# Patient Record
Sex: Female | Born: 1990 | Hispanic: Yes | Marital: Single
Health system: Midwestern US, Community
[De-identification: ages and names within clinical notes are randomized; demographics above are authoritative.]

## PROBLEM LIST (undated history)

## (undated) ENCOUNTER — Inpatient Hospital Stay (HOSPITAL_COMMUNITY): Payer: Self-pay

## (undated) DIAGNOSIS — B009 Herpesviral infection, unspecified: Secondary | ICD-10-CM

## (undated) DIAGNOSIS — B9689 Other specified bacterial agents as the cause of diseases classified elsewhere: Secondary | ICD-10-CM

## (undated) DIAGNOSIS — F419 Anxiety disorder, unspecified: Secondary | ICD-10-CM

## (undated) DIAGNOSIS — N760 Acute vaginitis: Secondary | ICD-10-CM

## (undated) DIAGNOSIS — O471 False labor at or after 37 completed weeks of gestation: Secondary | ICD-10-CM

## (undated) DIAGNOSIS — R1011 Right upper quadrant pain: Principal | ICD-10-CM

## (undated) DIAGNOSIS — N898 Other specified noninflammatory disorders of vagina: Secondary | ICD-10-CM

## (undated) DIAGNOSIS — K6289 Other specified diseases of anus and rectum: Secondary | ICD-10-CM

## (undated) HISTORY — PX: BUNIONECTOMY: SHX129

---
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
## 2013-07-27 ENCOUNTER — Other Ambulatory Visit: Payer: Self-pay | Admitting: Physician Assistant

## 2013-07-27 ENCOUNTER — Other Ambulatory Visit: Payer: Self-pay | Admitting: Internal Medicine

## 2013-07-27 DIAGNOSIS — R1011 Right upper quadrant pain: Secondary | ICD-10-CM

## 2013-07-28 ENCOUNTER — Other Ambulatory Visit: Payer: Self-pay

## 2013-08-01 ENCOUNTER — Other Ambulatory Visit: Payer: Self-pay

## 2013-11-12 ENCOUNTER — Emergency Department (HOSPITAL_COMMUNITY)
Admission: EM | Admit: 2013-11-12 | Discharge: 2013-11-12 | Disposition: A | Discharge status: Home / Self Care | Payer: Medicaid Other | Attending: Emergency Medicine | Admitting: Emergency Medicine

## 2013-11-12 ENCOUNTER — Encounter (HOSPITAL_COMMUNITY): Payer: Self-pay | Admitting: Emergency Medicine

## 2013-11-12 DIAGNOSIS — H9209 Otalgia, unspecified ear: Secondary | ICD-10-CM | POA: Insufficient documentation

## 2013-11-12 DIAGNOSIS — J069 Acute upper respiratory infection, unspecified: Secondary | ICD-10-CM

## 2013-11-12 DIAGNOSIS — O9989 Other specified diseases and conditions complicating pregnancy, childbirth and the puerperium: Secondary | ICD-10-CM | POA: Insufficient documentation

## 2013-11-12 MED ORDER — CHLORPHENIRAMINE MALEATE 4 MG PO TABS: 4.0000 mg | ORAL_TABLET | Freq: Two times a day (BID) | ORAL | Status: DC | PRN

## 2013-11-12 MED ORDER — IPRATROPIUM BROMIDE 0.03 % NA SOLN: 2.0000 | Freq: Two times a day (BID) | NASAL | Status: DC

## 2013-11-12 NOTE — Discharge Instructions (Signed)

## 2013-11-12 NOTE — ED Notes (Signed)
Per pt sts cough, congestion, sore throat and ear pain for a few days. sts she hasn't taken anything OTC due to being [redacted] weeks pregnant.

## 2013-11-12 NOTE — ED Provider Notes (Signed)
CSN: [id number]     Arrival date & time 11/12/13  1728 History   None    This chart was scribed for non-physician practitioner, Junious Silk PA-C, working with Flint Melter, MD by Arlan Organ, ED Scribe. This patient was seen in room TR08C/TR08C and the patient's care was started at 5:38 PM.   Chief Complaint  Patient presents with  . URI   The history is provided by the patient. No language interpreter was used.    HPI Comments: Alyssa Mcbride is a 23 y.o. female [redacted] weeks gestation who presents to the Emergency Department complaining of a URI x 3 days that is progressively worsening. She reports a non-productive cough, congestion, sore throat, rhinorrhea, myalgias, subjective fever, and left sided otalgia. She states she has tried OTC  Tylenol with no noticeable improvement. Last dose of Tylenol 10 AM this morning. At this time she denies any vomiting. Pt has no pertinent medical history, and no other concerns this visit.   History reviewed. No pertinent past medical history. History reviewed. No pertinent past surgical history. No family history on file. History  Substance Use Topics  . Smoking status: Not on file  . Smokeless tobacco: Not on file  . Alcohol Use: Not on file   OB History   Grav Para Term Preterm Abortions TAB SAB Ect Mult Living                 Review of Systems  Constitutional: Positive for fever. Negative for chills.  HENT: Positive for congestion, ear pain, rhinorrhea and sore throat.   Eyes: Negative for redness.  Respiratory: Positive for cough.   Gastrointestinal: Negative for vomiting.  Musculoskeletal: Positive for myalgias.  Skin: Negative for rash.  Psychiatric/Behavioral: Negative for confusion.  All other systems reviewed and are negative.      Allergies  Review of patient's allergies indicates not on file.  Home Medications  No current outpatient prescriptions on file.  Triage Vitals: BP 128/79  Pulse 94  Temp(Src) 98.8 F (37.1  C) (Oral)  Resp 16  Ht 5\' 4"  (1.626 m)  Wt 148 lb 2 oz (67.189 kg)  BMI 25.41 kg/m2  SpO2 100%   Physical Exam  Nursing note and vitals reviewed. Constitutional: She is oriented to person, place, and time. She appears well-developed and well-nourished. No distress.  HENT:  Head: Normocephalic and atraumatic.  Right Ear: Hearing, tympanic membrane, external ear and ear canal normal.  Left Ear: Hearing, tympanic membrane, external ear and ear canal normal.  Nose: Rhinorrhea present. Right sinus exhibits no maxillary sinus tenderness and no frontal sinus tenderness. Left sinus exhibits no maxillary sinus tenderness and no frontal sinus tenderness.  Mouth/Throat: Oropharynx is clear and moist and mucous membranes are normal. No oropharyngeal exudate, posterior oropharyngeal edema, posterior oropharyngeal erythema or tonsillar abscesses.  Eyes: Conjunctivae are normal.  Neck: Normal range of motion.  Cardiovascular: Normal rate, regular rhythm and normal heart sounds.   Pulmonary/Chest: Effort normal and breath sounds normal. No stridor. No respiratory distress. She has no wheezes. She has no rales.  Abdominal: Soft. She exhibits no distension.  Musculoskeletal: Normal range of motion.  Neurological: She is alert and oriented to person, place, and time. She has normal strength.  Skin: Skin is warm and dry. She is not diaphoretic. No erythema.  Psychiatric: She has a normal mood and affect. Her behavior is normal.    ED Course  Procedures (including critical care time)  DIAGNOSTIC STUDIES: Oxygen Saturation is 100%  on RA, Normal by my interpretation.    COORDINATION OF CARE: 5:57 PM-Discussed treatment plan with pt at bedside and pt agreed to plan.     Labs Review Labs Reviewed - No data to display Imaging Review No results found.   EKG Interpretation None      MDM   Final diagnoses:  URI (upper respiratory infection)    Patients symptoms are consistent with URI, likely  viral etiology. Discussed that antibiotics are not indicated for viral infections. Pt will be discharged with symptomatic treatment.  Verbalizes understanding and is agreeable with plan. Pt is hemodynamically stable & in NAD prior to dc.   I personally performed the services described in this documentation, which was scribed in my presence. The recorded information has been reviewed and is accurate.    Mora BellmanHannah S Lorieann Argueta, PA-C 11/12/13 1845

## 2013-11-13 NOTE — ED Provider Notes (Signed)
Medical screening examination/treatment/procedure(s) were performed by non-physician practitioner and as supervising physician I was immediately available for consultation/collaboration.  Emanuelle Hammerstrom L Denia Mcvicar, MD 11/13/13 0040 

## 2013-12-04 ENCOUNTER — Emergency Department (INDEPENDENT_AMBULATORY_CARE_PROVIDER_SITE_OTHER)
Admission: EM | Admit: 2013-12-04 | Discharge: 2013-12-04 | Disposition: A | Discharge status: Home / Self Care | Payer: Self-pay | Source: Home / Self Care | Attending: Emergency Medicine | Admitting: Emergency Medicine

## 2013-12-04 ENCOUNTER — Other Ambulatory Visit (HOSPITAL_COMMUNITY)
Admission: RE | Admit: 2013-12-04 | Discharge: 2013-12-04 | Disposition: A | Discharge status: Home / Self Care | Payer: Medicaid Other | Source: Ambulatory Visit | Attending: Emergency Medicine | Admitting: Emergency Medicine

## 2013-12-04 ENCOUNTER — Encounter (HOSPITAL_COMMUNITY): Payer: Self-pay | Admitting: Emergency Medicine

## 2013-12-04 DIAGNOSIS — N760 Acute vaginitis: Secondary | ICD-10-CM | POA: Insufficient documentation

## 2013-12-04 DIAGNOSIS — Z113 Encounter for screening for infections with a predominantly sexual mode of transmission: Secondary | ICD-10-CM | POA: Insufficient documentation

## 2013-12-04 DIAGNOSIS — A499 Bacterial infection, unspecified: Secondary | ICD-10-CM

## 2013-12-04 DIAGNOSIS — B009 Herpesviral infection, unspecified: Secondary | ICD-10-CM

## 2013-12-04 DIAGNOSIS — B9689 Other specified bacterial agents as the cause of diseases classified elsewhere: Secondary | ICD-10-CM

## 2013-12-04 HISTORY — DX: Other specified bacterial agents as the cause of diseases classified elsewhere: B96.89

## 2013-12-04 HISTORY — DX: Other specified bacterial agents as the cause of diseases classified elsewhere: N76.0

## 2013-12-04 LAB — POCT URINALYSIS DIP (DEVICE)
BILIRUBIN URINE: NEGATIVE
Glucose, UA: NEGATIVE mg/dL
KETONES UR: NEGATIVE mg/dL
Leukocytes, UA: NEGATIVE
Nitrite: NEGATIVE
PH: 7 (ref 5.0–8.0)
Protein, ur: NEGATIVE mg/dL
Urobilinogen, UA: 1 mg/dL (ref 0.0–1.0)

## 2013-12-04 LAB — POCT PREGNANCY, URINE: PREG TEST UR: NEGATIVE

## 2013-12-04 MED ORDER — ACYCLOVIR 400 MG PO TABS: ORAL_TABLET | ORAL | Status: DC

## 2013-12-04 MED ORDER — ACYCLOVIR 400 MG PO TABS: 400.0000 mg | ORAL_TABLET | Freq: Two times a day (BID) | ORAL | Status: DC

## 2013-12-04 MED ORDER — METRONIDAZOLE 500 MG PO TABS: 500.0000 mg | ORAL_TABLET | Freq: Two times a day (BID) | ORAL | Status: DC

## 2013-12-04 NOTE — Discharge Instructions (Signed)
Bacterial Vaginosis Bacterial vaginosis is a vaginal infection that occurs when the normal balance of bacteria in the vagina is disrupted. It results from an overgrowth of certain bacteria. This is the most common vaginal infection in women of childbearing age. Treatment is important to prevent complications, especially in pregnant women, as it can cause a premature delivery. CAUSES  Bacterial vaginosis is caused by an increase in harmful bacteria that are normally present in smaller amounts in the vagina. Several different kinds of bacteria can cause bacterial vaginosis. However, the reason that the condition develops is not fully understood. RISK FACTORS Certain activities or behaviors can put you at an increased risk of developing bacterial vaginosis, including:  Having a new sex partner or multiple sex partners.  Douching.  Using an intrauterine device (IUD) for contraception. Women do not get bacterial vaginosis from toilet seats, bedding, swimming pools, or contact with objects around them. SIGNS AND SYMPTOMS  Some women with bacterial vaginosis have no signs or symptoms. Common symptoms include:  Grey vaginal discharge.  A fishlike odor with discharge, especially after sexual intercourse.  Itching or burning of the vagina and vulva.  Burning or pain with urination. DIAGNOSIS  Your health care provider will take a medical history and examine the vagina for signs of bacterial vaginosis. A sample of vaginal fluid may be taken. Your health care provider will look at this sample under a microscope to check for bacteria and abnormal cells. A vaginal pH test may also be done.  TREATMENT  Bacterial vaginosis may be treated with antibiotic medicines. These may be given in the form of a pill or a vaginal cream. A second round of antibiotics may be prescribed if the condition comes back after treatment.  HOME CARE INSTRUCTIONS   Only take over-the-counter or prescription medicines as  directed by your health care provider.  If antibiotic medicine was prescribed, take it as directed. Make sure you finish it even if you start to feel better.  Do not have sex until treatment is completed.  Tell all sexual partners that you have a vaginal infection. They should see their health care provider and be treated if they have problems, such as a mild rash or itching.  Practice safe sex by using condoms and only having one sex partner. SEEK MEDICAL CARE IF:   Your symptoms are not improving after 3 days of treatment.  You have increased discharge or pain.  You have a fever. MAKE SURE YOU:   Understand these instructions.  Will watch your condition.  Will get help right away if you are not doing well or get worse. FOR MORE INFORMATION  Centers for Disease Control and Prevention, Division of STD Prevention: [employer].za American Sexual Health Association (ASHA): www.ashastd.org  Document Released: 08/11/2005 Document Revised: 06/01/2013 Document Reviewed: 03/23/2013 [facility] Patient Information 2014 [place], [place].  Herpes Simplex Virus Herpes simplex virus is a viral infection that may infect many different areas of the body, such as the genitalia and mouth. There are two different strains of the virus: herpes simplex virus 1 (HSV-1) and herpes simplex virus 2 (HSV-2). HSV-1 is typically associated with infections of the mouth and lips. HSV-2 is associated with infections of the genitals. However, either strain of the virus may infect any area. HSV may be spread through saliva particles or sexual contact. One unusual form of HSV-1, known as herpes gladiatorum, is passed from skin-to-skin contact, such as in wrestling. SYMPTOMS   Sometimes, no symptoms.  Fever.  Headache.  Muscle  aches.  Tingling.  Itching.  Tenderness.  Genital burning feeling.  Genital pain.  Pain with urination.  Pain with sexual intercourse.  Small blisters in the affected  areas. RISK FACTORS   Kissing an infected person.  Sharing eating utensils with an infected person.  Unprotected sexual activity.  Multiple sexual partners.  Direct contact sports without protective clothing.  Contact with an exposed herpes sore.  Stress, illness, and cold increase the risk of recurrence. PROGNOSIS  The primary outbreak of an HSV infection usually lasts 2 to 3 weeks. However, it has been known to last up to 6 weeks. After the primary outbreak subsides, the virus goes into a stage known as latency. During this time, there may be no physical symptoms of infection. After a period of time, some event, such as stress, cold, or illness will trigger another outbreak. This cycle of latency and outbreak may continue indefinitely. The outbreaks usually become milder over time. The body cannot rid itself of HSV. RELATED COMPLICATIONS   Recurrence.  Infection in other areas of the body, such as the eye (ocular herpetic infection, keratitis) and rarely the brain (herpetic encephalitis). TREATMENT  Many HSV infections can be treated without medicine. During an outbreak, avoid touching the sores. Ice may be used to dull the pain and suppress the virus. Exposure to the sun is a common trigger for an outbreak, so the use of sunscreen may help in such cases. Avoid sexual contact during outbreaks. During the latent periods, it is advised that you use latex condoms, which will reduce the likelihood of spreading the virus to another person. Condoms made from animal products do not protect against HSV. Female condoms cover a larger area than female condoms, and may offer the most protection from the transmission of HSV. The presence of HSV will not affect a condom's ability to protect against pregnancy. Only take medicines for pain and discomfort if directed to do so by your caregiver. Many claims exist that certain dietary changes will prevent an outbreak, but these claims have not been proven.  These claims include eating foods that are high in L-lysine and low in arginine (i.e. yogurt, beets, apples, pears, mangoes, oily fish (such as salmon, haddock, snapper, and swordfish), soybean sprouts, chicken, and tomatoes).  Athletes may return to play once they are showing no symptoms, and they have been treated.  Document Released: 08/11/2005 Document Revised: 11/03/2011 Document Reviewed: 11/23/2008 [facility] Patient Information 2014 [place].

## 2013-12-04 NOTE — ED Provider Notes (Signed)
Chief Complaint   Chief Complaint  Patient presents with  . Vaginitis  . Rash    History of Present Illness   Alyssa Mcbride is a 23 year old female who's had a one-week history of vaginal discharge and odor. She denies any itching. She has had mild pelvic pain and cramping. No fever, chills, or vomiting. She has been somewhat nauseated. Last menstrual period was March 30. The patient also has a 2 to three-day history of an itchy rash on her left buttock. This occurs in the same spot about every 6 months.  Review of Systems   Other than as noted above, the patient denies any of the following symptoms: Systemic:  No fever or chills GI:  No abdominal pain, nausea, vomiting, diarrhea, constipation, melena or hematochezia. GU:  No dysuria, frequency, urgency, hematuria, vaginal discharge, itching, or abnormal vaginal bleeding.  PMFSH   Past medical history, family history, social history, meds, and allergies were reviewed.    Physical Examination    Vital signs:  BP 126/80  Pulse 80  Temp(Src) 98.4 F (36.9 C) (Oral)  Resp 16  SpO2 100%  LMP 11/21/2013  Breastfeeding? Unknown General:  Alert, oriented and in no distress. Lungs:  Breath sounds clear and equal bilaterally.  No wheezes, rales or rhonchi. Heart:  Regular rhythm.  No gallops or murmers. Abdomen:  Soft, flat and non-distended.  No organomegaly or mass.  No tenderness, guarding or rebound.  Bowel sounds normally active. Pelvic exam:  Normal external genitalia, vaginal and cervical mucosa were normal, there was no vaginal discharge or odor. No pain on cervical motion, uterus was normal in size and shape and nontender. No adnexal masses or tenderness.  DNA probes for gonorrhea, Chlamydia, Trichomonas, Gardnerella, Candida were obtained. Skin:  There is a patch of vesicles on erythematous base on the left buttock.  Labs   Results for orders placed during the hospital encounter of 12/04/13  POCT URINALYSIS DIP (DEVICE)       Result Value Ref Range   Glucose, UA NEGATIVE  NEGATIVE mg/dL   Bilirubin Urine NEGATIVE  NEGATIVE   Ketones, ur NEGATIVE  NEGATIVE mg/dL   Specific Gravity, Urine >=1.030  1.005 - 1.030   Hgb urine dipstick TRACE (*) NEGATIVE   pH 7.0  5.0 - 8.0   Protein, ur NEGATIVE  NEGATIVE mg/dL   Urobilinogen, UA 1.0  0.0 - 1.0 mg/dL   Nitrite NEGATIVE  NEGATIVE   Leukocytes, UA NEGATIVE  NEGATIVE  POCT PREGNANCY, URINE      Result Value Ref Range   Preg Test, Ur NEGATIVE  NEGATIVE    A herpes simplex viral culture was obtained of the lesions on the left buttock.  Assessment   The primary encounter diagnosis was Bacterial vaginosis. 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  Discussed herpes simplex viral infection with the patient. She was given a course of Zovirax to take 1 every 4 hours while awake 5 times daily for the next week followed by one tablet twice a day for suppression.       Plan    1.  Meds:  The following meds were prescribed:   Discharge Medication List as of 12/04/2013  7:33 PM    START taking these medications   Details  !! acyclovir (ZOVIRAX) 400 MG tablet 1 every 4 hours while awake, 5 times daily for 1 week., Normal    !! acyclovir (ZOVIRAX) 400 MG tablet Take 1 tablet (400 mg total) by  mouth 2 (two) times daily., Starting 12/04/2013, Until Discontinued, Print    metroNIDAZOLE (FLAGYL) 500 MG tablet Take 1 tablet (500 mg total) by mouth 2 (two) times daily., Starting 12/04/2013, Until Discontinued, Normal     !! - Potential duplicate medications found. Please discuss with provider.      2.  Patient Education/Counseling:  The patient was given appropriate handouts, self care instructions, and instructed in symptomatic relief.    3.  Follow up:  The patient was told to follow up here if no better in 3 to 4 days, or sooner if becoming worse in any way, and given some red flag symptoms such as worsening pain, fever, persistent vomiting, or  heavy vaginal bleeding which would prompt immediate return.       Reuben Likes, MD 12/04/13 [phone]

## 2013-12-04 NOTE — ED Notes (Signed)
C/O foul vaginal odor x 2 wks, vaginal discharge x 1 wk.  Has had BV in past.  Also c/o pruritic rash on buttock x 2 days.  Now c/o low abd cramping.

## 2013-12-05 LAB — CERVICOVAGINAL ANCILLARY ONLY
Chlamydia: NEGATIVE
NEISSERIA GONORRHEA: NEGATIVE

## 2013-12-05 NOTE — Progress Notes (Signed)
Quick Note:  Test result was normal. No further action is needed at this time. ______ 

## 2013-12-05 NOTE — ED Notes (Signed)
GC/Chlamydia neg., Affirm: Candida and Trich neg., Gardnerella pos.  Pt. adequately treated with Flagyl.  Herpes culture pending. Desiree LucySuzanne M [facility] 12/05/2013

## 2013-12-06 LAB — CERVICOVAGINAL ANCILLARY ONLY
WET PREP (BD AFFIRM): NEGATIVE
WET PREP (BD AFFIRM): POSITIVE — AB
Wet Prep (BD Affirm): NEGATIVE

## 2013-12-07 ENCOUNTER — Telehealth (HOSPITAL_COMMUNITY): Payer: Self-pay | Admitting: *Deleted

## 2013-12-07 LAB — HERPES SIMPLEX VIRUS CULTURE: Culture: DETECTED

## 2013-12-07 NOTE — ED Notes (Signed)
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 Pt. adequately treated with Zovirax.  I called pt. Pt. verified x 2 and given results. Pt. told she was adequately treated for bacterial vaginosis with Flagyl and Herpes with Zovirax.  Pt. instructed to notify her partner, that she can pass the virus even when she doesn't have an outbreak, so always practice safe sex.  Instructed to get treated for each outbreak with Acyclovir or Valtrex. You should get an OB-GYN doctor or PCP who can give you a 1 yr prescription to fill when you have an outbreak or give suppressive therapy if needed. Pt. said she does not have insurance. I recommended she try to get some.  Pt. voiced understanding. Alyssa LucySuzanne Mcbride [facility]Alyssa Mcbride 12/07/2013

## 2014-03-06 ENCOUNTER — Other Ambulatory Visit (HOSPITAL_COMMUNITY): Payer: Self-pay | Admitting: Nurse Practitioner

## 2014-03-06 DIAGNOSIS — Z3682 Encounter for antenatal screening for nuchal translucency: Secondary | ICD-10-CM

## 2014-03-06 LAB — OB RESULTS CONSOLE ABO/RH: RH TYPE: POSITIVE

## 2014-03-06 LAB — OB RESULTS CONSOLE ANTIBODY SCREEN: ANTIBODY SCREEN: NEGATIVE

## 2014-03-06 LAB — OB RESULTS CONSOLE RUBELLA ANTIBODY, IGM: Rubella: IMMUNE

## 2014-03-06 LAB — OB RESULTS CONSOLE RPR: RPR: NONREACTIVE

## 2014-03-06 LAB — OB RESULTS CONSOLE HEPATITIS B SURFACE ANTIGEN: Hepatitis B Surface Ag: NEGATIVE

## 2014-03-06 LAB — OB RESULTS CONSOLE HIV ANTIBODY (ROUTINE TESTING): HIV: NONREACTIVE

## 2014-03-08 ENCOUNTER — Other Ambulatory Visit (HOSPITAL_COMMUNITY): Payer: Self-pay | Admitting: Nurse Practitioner

## 2014-03-08 ENCOUNTER — Ambulatory Visit (HOSPITAL_COMMUNITY)
Admission: RE | Admit: 2014-03-08 | Discharge: 2014-03-08 | Disposition: A | Discharge status: Home / Self Care | Payer: Medicaid Other | Source: Ambulatory Visit | Attending: Nurse Practitioner | Admitting: Nurse Practitioner

## 2014-03-08 ENCOUNTER — Ambulatory Visit (HOSPITAL_COMMUNITY): Admission: RE | Admit: 2014-03-08 | Payer: Medicaid Other | Source: Ambulatory Visit

## 2014-03-08 ENCOUNTER — Encounter (HOSPITAL_COMMUNITY): Payer: Self-pay

## 2014-03-08 DIAGNOSIS — Z3682 Encounter for antenatal screening for nuchal translucency: Secondary | ICD-10-CM

## 2014-03-08 DIAGNOSIS — Z36 Encounter for antenatal screening of mother: Secondary | ICD-10-CM | POA: Insufficient documentation

## 2014-03-20 LAB — OB RESULTS CONSOLE GC/CHLAMYDIA
Chlamydia: NEGATIVE
Gonorrhea: NEGATIVE

## 2014-03-23 ENCOUNTER — Encounter (HOSPITAL_COMMUNITY): Payer: Self-pay

## 2014-03-23 ENCOUNTER — Ambulatory Visit (HOSPITAL_COMMUNITY)
Admission: RE | Admit: 2014-03-23 | Discharge: 2014-03-23 | Disposition: A | Discharge status: Home / Self Care | Payer: Medicaid Other | Source: Ambulatory Visit | Attending: Nurse Practitioner | Admitting: Nurse Practitioner

## 2014-03-23 ENCOUNTER — Other Ambulatory Visit: Payer: Self-pay

## 2014-03-23 DIAGNOSIS — Z36 Encounter for antenatal screening of mother: Secondary | ICD-10-CM | POA: Insufficient documentation

## 2014-03-23 DIAGNOSIS — Z3682 Encounter for antenatal screening for nuchal translucency: Secondary | ICD-10-CM

## 2014-03-28 ENCOUNTER — Other Ambulatory Visit (HOSPITAL_COMMUNITY): Payer: Self-pay | Admitting: Nurse Practitioner

## 2014-03-28 DIAGNOSIS — Z3689 Encounter for other specified antenatal screening: Secondary | ICD-10-CM

## 2014-04-10 ENCOUNTER — Ambulatory Visit (INDEPENDENT_AMBULATORY_CARE_PROVIDER_SITE_OTHER): Payer: Medicaid Other | Admitting: Internal Medicine

## 2014-04-10 ENCOUNTER — Encounter: Payer: Self-pay | Admitting: Internal Medicine

## 2014-04-10 VITALS — BP 100/71 | HR 75 | Ht 64.0 in | Wt 147.0 lb

## 2014-04-10 DIAGNOSIS — R002 Palpitations: Secondary | ICD-10-CM

## 2014-04-10 NOTE — Patient Instructions (Addendum)
Your physician recommends that you continue on your current medications as directed. Please refer to the Current Medication list given to you today. Your physician recommends that you schedule a follow-up appointment AS NECESSARY WITH DR ROSS.

## 2014-04-10 NOTE — Progress Notes (Signed)
HPI Patient is a 23 yo who is referred for evalaution of chest discomfort   The patient has no known history of cardiac problems.  She is currently [redacted] wks pregnant.(first pregnancy)  Says she has had episode of a heaviness in chst  Weird feeling  Not every day.  Occaionally dizzy.  One time almost passed out. Over the past week has been feeling better.  No spells   No Known Allergies  Current Outpatient Prescriptions  Medication Sig Dispense Refill  . Prenatal Vit-Fe Fumarate-FA (MULTIVITAMIN-PRENATAL) 27-0.8 MG TABS tablet Take 1 tablet by mouth daily at 12 noon.       No current facility-administered medications for this visit.    Past Medical History  Diagnosis Date  . BV (bacterial vaginosis)     History reviewed. No pertinent past surgical history.  Family History  Problem Relation Age of Onset  . Heart attack Neg Hx   . Hypertension Mother     History   Social History  . Marital Status: Single    Spouse Name: N/A    Number of Children: N/A  . Years of Education: N/A   Occupational History  . Not on file.   Social History Main Topics  . Smoking status: Never Smoker   . Smokeless tobacco: Not on file  . Alcohol Use: Yes     Comment: occasional  . Drug Use: Yes    Special: Marijuana  . Sexual Activity: Yes    Birth Control/ Protection: Condom   Other Topics Concern  . Not on file   Social History Narrative  . No narrative on file    Review of Systems:  All systems reviewed.  They are negative to the above problem except as previously stated.  Vital Signs: BP 100/71  Pulse 75  Ht 5\' 4"  (1.626 m)  Wt 147 lb (66.679 kg)  BMI 25.22 kg/m2  LMP 12/05/2013  Physical Exam Patient is in NAD HEENT:  Normocephalic, atraumatic. EOMI, PERRLA.  Neck: JVP is normal.  No bruits.  Lungs: clear to auscultation. No rales no wheezes.  Heart: Regular rate and rhythm. Normal S1, S2. No S3.   No significant murmurs. PMI not displaced.  Abdomen:  Supple, nontender.  Normal bowel sounds. No masses. No hepatomegaly.  Extremities:   Good distal pulses throughout. No lower extremity edema.  Musculoskeletal :moving all extremities.  Neuro:   alert and oriented x3.  CN II-XII grossly intact.  EKG  SR 75 bpm    Assessment and Plan:  1.  Chest discomfort  Patient is vague on symptoms.  Improving   I am not convinced there is a problem  May be related to changes with pregnancy, maintenance of BP Since symptoms have eased I would recomm following   Encouraged her to stay hydrated.  Stay active.   Will be available as needed.

## 2014-04-20 ENCOUNTER — Inpatient Hospital Stay (HOSPITAL_COMMUNITY)
Admission: AD | Admit: 2014-04-20 | Discharge: 2014-04-21 | Disposition: A | Discharge status: Home / Self Care | Payer: Medicaid Other | Source: Ambulatory Visit | Attending: Obstetrics & Gynecology | Admitting: Obstetrics & Gynecology

## 2014-04-20 ENCOUNTER — Encounter (HOSPITAL_COMMUNITY): Payer: Self-pay | Admitting: *Deleted

## 2014-04-20 DIAGNOSIS — B3731 Acute candidiasis of vulva and vagina: Secondary | ICD-10-CM

## 2014-04-20 DIAGNOSIS — N760 Acute vaginitis: Secondary | ICD-10-CM | POA: Insufficient documentation

## 2014-04-20 DIAGNOSIS — O2390 Unspecified genitourinary tract infection in pregnancy, unspecified trimester: Secondary | ICD-10-CM | POA: Insufficient documentation

## 2014-04-20 DIAGNOSIS — N909 Noninflammatory disorder of vulva and perineum, unspecified: Secondary | ICD-10-CM | POA: Insufficient documentation

## 2014-04-20 DIAGNOSIS — B373 Candidiasis of vulva and vagina: Secondary | ICD-10-CM

## 2014-04-20 DIAGNOSIS — B9689 Other specified bacterial agents as the cause of diseases classified elsewhere: Secondary | ICD-10-CM

## 2014-04-20 DIAGNOSIS — B009 Herpesviral infection, unspecified: Secondary | ICD-10-CM | POA: Diagnosis present

## 2014-04-20 HISTORY — DX: Herpesviral infection, unspecified: B00.9

## 2014-04-20 LAB — WET PREP, GENITAL: TRICH WET PREP: NONE SEEN

## 2014-04-20 MED ORDER — FLUCONAZOLE 150 MG PO TABS: 150.0000 mg | ORAL_TABLET | Freq: Once | ORAL | Status: DC

## 2014-04-20 MED ORDER — METRONIDAZOLE 500 MG PO TABS
500.0000 mg | ORAL_TABLET | Freq: Two times a day (BID) | ORAL | Status: AC
Start: 2014-04-20 — End: 2014-04-27

## 2014-04-20 NOTE — MAU Provider Note (Addendum)
Faculty Practice OB/GYN Attending Note  Subjective:  23 y.o. G2P0010 at [redacted]w[redacted]d here for evaluation of vulvar irritation; reports feeling "dry and swollen" with vaginal itching and thick yellow discharge.  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  Denies any fevers.  Gets prenatal care at [facility]      Objective:  Blood pressure 109/59, pulse 79, temperature 98.2 F (36.8 C), temperature source Oral, resp. rate 20, height  (1.6 m), weight 152 lb 4 oz (69.06 kg), last menstrual period 12/05/2013, unknown if currently breastfeeding. FHT  Doppler 152 bpm Gen: NAD Pelvic: Normal external genitalia, no ulcers seen, no erythema. On speculum exam, copious amount of thick, yellow discharge in vaginal vault with mild erythema.  Ext: 2+ DTRs, no edema, no cyanosis, negative Homan's sign  Results for orders placed during the hospital encounter of 04/20/14 (from the past 24 hour(s))  WET PREP, GENITAL     Status: Abnormal   Collection Time    04/20/14 11:05 PM      Result Value Ref Range   Yeast Wet Prep HPF POC FEW (*) NONE SEEN   Trich, Wet Prep NONE SEEN  NONE SEEN   Clue Cells Wet Prep HPF POC FEW (*) NONE SEEN   WBC, Wet Prep HPF POC FEW (*) NONE SEEN    Assessment & Plan:  23 y.o. G2P0010 at [redacted]w[redacted]d with vulvovaginitis consistent with possible yeast infection, Wet prep done which shows few yeast and clue cells concerning for BV, GC/Chlam pending Diflucan and Metronidazole ordered Follow up with [facility] for routine prenatal care   Jaynie Collins, MD, FACOG Attending Obstetrician & Gynecologist Faculty Practice, [facility]

## 2014-04-20 NOTE — MAU Note (Addendum)
PT SAYS   ON Friday -  HAD VAG ITCHING AND D/C- BUT DRY  AND PAINFUL.  FEELS SWOLLEN.    LAST SEX-  8-17.  GETS [facility] [place]- [facility].   SEEN LAST  -  8-7.  NEXT APPOINTMENT --9-8.    SAYS SOMETIMES  SHE HAS H/A-  BUT NONE NOW.

## 2014-04-21 DIAGNOSIS — B3731 Acute candidiasis of vulva and vagina: Secondary | ICD-10-CM

## 2014-04-21 DIAGNOSIS — N760 Acute vaginitis: Secondary | ICD-10-CM

## 2014-04-21 DIAGNOSIS — A499 Bacterial infection, unspecified: Secondary | ICD-10-CM

## 2014-04-21 DIAGNOSIS — B373 Candidiasis of vulva and vagina: Secondary | ICD-10-CM

## 2014-04-21 DIAGNOSIS — B9689 Other specified bacterial agents as the cause of diseases classified elsewhere: Secondary | ICD-10-CM

## 2014-04-21 LAB — GC/CHLAMYDIA PROBE AMP
CT Probe RNA: NEGATIVE
GC Probe RNA: NEGATIVE

## 2014-05-10 ENCOUNTER — Ambulatory Visit (HOSPITAL_COMMUNITY): Payer: Medicaid Other

## 2014-05-10 ENCOUNTER — Ambulatory Visit (HOSPITAL_COMMUNITY)
Admission: RE | Admit: 2014-05-10 | Discharge: 2014-05-10 | Disposition: A | Discharge status: Home / Self Care | Payer: Medicaid Other | Source: Ambulatory Visit | Attending: Nurse Practitioner | Admitting: Nurse Practitioner

## 2014-05-10 DIAGNOSIS — Z3689 Encounter for other specified antenatal screening: Secondary | ICD-10-CM | POA: Insufficient documentation

## 2014-05-23 ENCOUNTER — Other Ambulatory Visit (HOSPITAL_COMMUNITY): Payer: Self-pay | Admitting: Nurse Practitioner

## 2014-05-23 DIAGNOSIS — Z3689 Encounter for other specified antenatal screening: Secondary | ICD-10-CM

## 2014-06-14 ENCOUNTER — Ambulatory Visit (HOSPITAL_COMMUNITY)
Admission: RE | Admit: 2014-06-14 | Discharge: 2014-06-14 | Disposition: A | Discharge status: Home / Self Care | Payer: Medicaid Other | Source: Ambulatory Visit | Attending: Nurse Practitioner | Admitting: Nurse Practitioner

## 2014-06-14 DIAGNOSIS — Z36 Encounter for antenatal screening of mother: Secondary | ICD-10-CM | POA: Insufficient documentation

## 2014-06-14 DIAGNOSIS — Z3A24 24 weeks gestation of pregnancy: Secondary | ICD-10-CM | POA: Diagnosis not present

## 2014-06-14 DIAGNOSIS — Z3689 Encounter for other specified antenatal screening: Secondary | ICD-10-CM

## 2014-06-26 ENCOUNTER — Encounter (HOSPITAL_COMMUNITY): Payer: Self-pay | Admitting: *Deleted

## 2014-07-28 ENCOUNTER — Encounter (HOSPITAL_COMMUNITY): Payer: Self-pay | Admitting: General Practice

## 2014-07-28 ENCOUNTER — Inpatient Hospital Stay (HOSPITAL_COMMUNITY)
Admission: AD | Admit: 2014-07-28 | Discharge: 2014-07-28 | Disposition: A | Discharge status: Home / Self Care | Payer: Medicaid Other | Source: Ambulatory Visit | Attending: Obstetrics and Gynecology | Admitting: Obstetrics and Gynecology

## 2014-07-28 DIAGNOSIS — Z3A31 31 weeks gestation of pregnancy: Secondary | ICD-10-CM | POA: Diagnosis not present

## 2014-07-28 DIAGNOSIS — R109 Unspecified abdominal pain: Secondary | ICD-10-CM | POA: Insufficient documentation

## 2014-07-28 DIAGNOSIS — O9989 Other specified diseases and conditions complicating pregnancy, childbirth and the puerperium: Secondary | ICD-10-CM | POA: Insufficient documentation

## 2014-07-28 DIAGNOSIS — R1084 Generalized abdominal pain: Secondary | ICD-10-CM

## 2014-07-28 LAB — URINALYSIS, ROUTINE W REFLEX MICROSCOPIC
Bilirubin Urine: NEGATIVE
GLUCOSE, UA: 100 mg/dL — AB
Hgb urine dipstick: NEGATIVE
KETONES UR: NEGATIVE mg/dL
Leukocytes, UA: NEGATIVE
Nitrite: NEGATIVE
PH: 7 (ref 5.0–8.0)
PROTEIN: NEGATIVE mg/dL
Specific Gravity, Urine: 1.015 (ref 1.005–1.030)
Urobilinogen, UA: 0.2 mg/dL (ref 0.0–1.0)

## 2014-07-28 NOTE — Discharge Instructions (Signed)
Abdominal Pain During Pregnancy °Abdominal pain is common in pregnancy. Most of the time, it does not cause harm. There are many causes of abdominal pain. Some causes are more serious than others. Some of the causes of abdominal pain in pregnancy are easily diagnosed. Occasionally, the diagnosis takes time to understand. Other times, the cause is not determined. Abdominal pain can be a sign that something is very wrong with the pregnancy, or the pain may have nothing to do with the pregnancy at all. For this reason, always tell your health care provider if you have any abdominal discomfort. °HOME CARE INSTRUCTIONS  °Monitor your abdominal pain for any changes. The following actions may help to alleviate any discomfort you are experiencing: °· Do not have sexual intercourse or put anything in your vagina until your symptoms go away completely. °· Get plenty of rest until your pain improves. °· Drink clear fluids if you feel nauseous. Avoid solid food as long as you are uncomfortable or nauseous. °· Only take over-the-counter or prescription medicine as directed by your health care provider. °· Keep all follow-up appointments with your health care provider. °SEEK IMMEDIATE MEDICAL CARE IF: °· You are bleeding, leaking fluid, or passing tissue from the vagina. °· You have increasing pain or cramping. °· You have persistent vomiting. °· You have painful or bloody urination. °· You have a fever. °· You notice a decrease in your baby's movements. °· You have extreme weakness or feel faint. °· You have shortness of breath, with or without abdominal pain. °· You develop a severe headache with abdominal pain. °· You have abnormal vaginal discharge with abdominal pain. °· You have persistent diarrhea. °· You have abdominal pain that continues even after rest, or gets worse. °MAKE SURE YOU:  °· Understand these instructions. °· Will watch your condition. °· Will get help right away if you are not doing well or get  worse. °Document Released: 08/11/2005 Document Revised: 06/01/2013 Document Reviewed: 03/10/2013 °ExitCare® Patient Information ©2015 [employer]. This information is not intended to replace advice given to you by your health care provider. Make sure you discuss any questions you have with your health care provider. °Third Trimester of Pregnancy °The third trimester is from week 29 through week 42, months 7 through 9. The third trimester is a time when the fetus is growing rapidly. At the end of the ninth month, the fetus is about 20 inches in length and weighs 6-10 pounds.  °BODY CHANGES °Your body goes through many changes during pregnancy. The changes vary from woman to woman.  °· Your weight will continue to increase. You can expect to gain 25-35 pounds (11-16 kg) by the end of the pregnancy. °· You may begin to get stretch marks on your hips, abdomen, and breasts. °· You may urinate more often because the fetus is moving lower into your pelvis and pressing on your bladder. °· You may develop or continue to have heartburn as a result of your pregnancy. °· You may develop constipation because certain hormones are causing the muscles that push waste through your intestines to slow down. °· You may develop hemorrhoids or swollen, bulging veins (varicose veins). °· You may have pelvic pain because of the weight gain and pregnancy hormones relaxing your joints between the bones in your pelvis. Backaches may result from overexertion of the muscles supporting your posture. °· You may have changes in your hair. These can include thickening of your hair, rapid growth, and changes in texture. Some women   women also have hair loss during or after pregnancy, or hair that feels dry or thin. Your hair will most likely return to normal after your baby is born.  Your breasts will continue to grow and be tender. A yellow discharge may leak from your breasts called colostrum.  Your belly button may stick out.  You may feel  short of breath because of your expanding uterus.  You may notice the fetus "dropping," or moving lower in your abdomen.  You may have a bloody mucus discharge. This usually occurs a few days to a week before labor begins.  Your cervix becomes thin and soft (effaced) near your due date. WHAT TO EXPECT AT YOUR PRENATAL EXAMS  You will have prenatal exams every 2 weeks until week 36. Then, you will have weekly prenatal exams. During a routine prenatal visit:  You will be weighed to make sure you and the fetus are growing normally.  Your blood pressure is taken.  Your abdomen will be measured to track your baby's growth.  The fetal heartbeat will be listened to.  Any test results from the previous visit will be discussed.  You may have a cervical check near your due date to see if you have effaced. At around 36 weeks, your caregiver will check your cervix. At the same time, your caregiver will also perform a test on the secretions of the vaginal tissue. This test is to determine if a type of bacteria, Group B streptococcus, is present. Your caregiver will explain this further. Your caregiver may ask you:  What your birth plan is.  How you are feeling.  If you are feeling the baby move.  If you have had any abnormal symptoms, such as leaking fluid, bleeding, severe headaches, or abdominal cramping.  If you have any questions. Other tests or screenings that may be performed during your third trimester include:  Blood tests that check for low iron levels (anemia).  Fetal testing to check the health, activity level, and growth of the fetus. Testing is done if you have certain medical conditions or if there are problems during the pregnancy. FALSE LABOR You may feel small, irregular contractions that eventually go away. These are called Braxton Hicks contractions, or false labor. Contractions may last for hours, days, or even weeks before true labor sets in. If contractions come at  regular intervals, intensify, or become painful, it is best to be seen by your caregiver.  SIGNS OF LABOR   Menstrual-like cramps.  Contractions that are 5 minutes apart or less.  Contractions that start on the top of the uterus and spread down to the lower abdomen and back.  A sense of increased pelvic pressure or back pain.  A watery or bloody mucus discharge that comes from the vagina. If you have any of these signs before the 37th week of pregnancy, call your caregiver right away. You need to go to the hospital to get checked immediately. HOME CARE INSTRUCTIONS   Avoid all smoking, herbs, alcohol, and unprescribed drugs. These chemicals affect the formation and growth of the baby.  Follow your caregiver's instructions regarding medicine use. There are medicines that are either safe or unsafe to take during pregnancy.  Exercise only as directed by your caregiver. Experiencing uterine cramps is a good sign to stop exercising.  Continue to eat regular, healthy meals.  Wear a good support bra for breast tenderness.  Do not use hot tubs, steam rooms, or saunas.  Wear your seat belt at  all times when driving.  Avoid raw meat, uncooked cheese, cat litter boxes, and soil used by cats. These carry germs that can cause birth defects in the baby.  Take your prenatal vitamins.  Try taking a stool softener (if your caregiver approves) if you develop constipation. Eat more high-fiber foods, such as fresh vegetables or fruit and whole grains. Drink plenty of fluids to keep your urine clear or pale yellow.  Take warm sitz baths to soothe any pain or discomfort caused by hemorrhoids. Use hemorrhoid cream if your caregiver approves.  If you develop varicose veins, wear support hose. Elevate your feet for 15 minutes, 3-4 times a day. Limit salt in your diet.  Avoid heavy lifting, wear low heal shoes, and practice good posture.  Rest a lot with your legs elevated if you have leg cramps or  low back pain.  Visit your dentist if you have not gone during your pregnancy. Use a soft toothbrush to brush your teeth and be gentle when you floss.  A sexual relationship may be continued unless your caregiver directs you otherwise.  Do not travel far distances unless it is absolutely necessary and only with the approval of your caregiver.  Take prenatal classes to understand, practice, and ask questions about the labor and delivery.  Make a trial run to the hospital.  Pack your hospital bag.  Prepare the baby's nursery.  Continue to go to all your prenatal visits as directed by your caregiver. SEEK MEDICAL CARE IF:  You are unsure if you are in labor or if your water has broken.  You have dizziness.  You have mild pelvic cramps, pelvic pressure, or nagging pain in your abdominal area.  You have persistent nausea, vomiting, or diarrhea.  You have a bad smelling vaginal discharge.  You have pain with urination. SEEK IMMEDIATE MEDICAL CARE IF:   You have a fever.  You are leaking fluid from your vagina.  You have spotting or bleeding from your vagina.  You have severe abdominal cramping or pain.  You have rapid weight loss or gain.  You have shortness of breath with chest pain.  You notice sudden or extreme swelling of your face, hands, ankles, feet, or legs.  You have not felt your baby move in over an hour.  You have severe headaches that do not go away with medicine.  You have vision changes. Document Released: 08/05/2001 Document Revised: 08/16/2013 Document Reviewed: 10/12/2012 [facility] Patient Information 2015 [place], [place]. This information is not intended to replace advice given to you by your health care provider. Make sure you discuss any questions you have with your health care provider.PRETERM LABOR: Includes any of the following symptoms that occur between 20-[redacted] weeks gestation. If these symptoms are not stopped, preterm labor can result in preterm  delivery, placing your baby at risk.  Notify your doctor if any of the following occur: 1. Menstrual-like cramps   5. Pelvic pressure  2. Uterine contractions. These may be painless and feel like the uterus is tightening or the baby is "balling up" 6. Increase or change in vaginal discharge  3. Low, dull backache, unrelieved by heat or Tylenol  7. Vaginal bleeding  4. Intestinal cramps, with our without diarrhea, sometimes 8. A general feeling that "something is not right"   9. Leaking of fluid described as "gas pain"

## 2014-07-28 NOTE — MAU Note (Signed)
Pt states she had excruciating pain last night lasting 2 hours. She states the pain was over entire abdomen and states it felt like her placenta was going to explode. She states the pain was constant for two hours and that after that it was intermittent and then eventually subsided after another 3 hours. She laid down and went to sleep. Today she has no pain.

## 2014-07-28 NOTE — MAU Provider Note (Signed)
History  Chief Complaint:  Abdominal Pain  Alyssa Mcbride is a 23 y.o. G2P0010 female at 6936w0d presenting for abdominal pain from 1730 - 1930 on 07/27/14, that spontaneously resolved. Denies any vaginal bleeding, cramping or leakage of fluids.  Denies any other pain since the above mentioned incident and denies any pain currently.   Reports active fetal movement, contractions: none, vaginal bleeding: none, membranes: intact. Denies uti s/s, abnormal/malodorous vag d/c or vulvovaginal itching/irritation.   [facility]  Next visit 08/08/14. Pregnancy complicated by HSV2.  Obstetrical History: OB History    Gravida Para Term Preterm AB TAB SAB Ectopic Multiple Living   2 0 0 0 1 1 0 0 0 0       Past Medical History: Past Medical History  Diagnosis Date  . BV (bacterial vaginosis)   . HSV-2 infection     Positive culture on 12/04/13; primary outbreak    Past Surgical History: History reviewed. No pertinent past surgical history.  Social History: History   Social History  . Marital Status: Single    Spouse Name: N/A    Number of Children: N/A  . Years of Education: N/A   Social History Main Topics  . Smoking status: Never Smoker   . Smokeless tobacco: None  . Alcohol Use: Yes     Comment: LAST DRANK-  01-14-2014  . Drug Use: Yes    Special: Marijuana     Comment: LAST USED-  03-30-2014  . Sexual Activity: Yes    Birth Control/ Protection: Condom   Other Topics Concern  . None   Social History Narrative    Allergies: No Known Allergies  Prescriptions prior to admission  Medication Sig Dispense Refill Last Dose  . Prenatal Vit-Fe Fumarate-FA (MULTIVITAMIN-PRENATAL) 27-0.8 MG TABS tablet Take 1 tablet by mouth daily at 12 noon.   Past Week at Unknown time  . fluconazole (DIFLUCAN) 150 MG tablet Take 1 tablet (150 mg total) by mouth once. Can take additional dose three days later if symptoms persist (Patient not taking: Reported on  07/28/2014) 1 tablet 3     Review of Systems  Pertinent pos/neg as indicated in HPI  Physical Exam  Blood pressure 113/72, pulse 82, temperature 97.8 F (36.6 C), temperature source Oral, resp. rate 18, height 5' 4.76" (1.645 m), weight 162 lb (73.483 kg), last menstrual period 12/05/2013, unknown if currently breastfeeding. General appearance: alert, cooperative, appears stated age and no distress Lungs: clear to auscultation bilaterally, normal effort Heart: regular rate and rhythm Abdomen: gravid, soft, non-tender Extremities: No edema     Fetal monitoring: FHR: 135 bpm, variability: moderate,  Accelerations: Present,  decelerations:  Absent Uterine activity: none  MAU Course  NST- active  Labs:  Results for orders placed or performed during the hospital encounter of 07/28/14 (from the past 24 hour(s))  Urinalysis, Routine w reflex microscopic     Status: Abnormal   Collection Time: 07/28/14  9:20 AM  Result Value Ref Range   Color, Urine YELLOW YELLOW   APPearance CLEAR CLEAR   Specific Gravity, Urine 1.015 1.005 - 1.030   pH 7.0 5.0 - 8.0   Glucose, UA 100 (A) NEGATIVE mg/dL   Hgb urine dipstick NEGATIVE NEGATIVE   Bilirubin Urine NEGATIVE NEGATIVE   Ketones, ur NEGATIVE NEGATIVE mg/dL   Protein, ur NEGATIVE NEGATIVE mg/dL   Urobilinogen, UA 0.2 0.0 - 1.0 mg/dL   Nitrite NEGATIVE NEGATIVE   Leukocytes, UA NEGATIVE NEGATIVE    Imaging:  N/A   Assessment and Plan  A:  725w0d SIUP  G2P0010  Concerns about abdominal pain that occurred yesterday  Cat 1 FHR P:  Reviewed fetal activity counts  Reviewed preterm labor precautions  [facility]   ADAMS,SHNIQUAL SHWON Student NM 12/4/201510:18 AM   I have participated in the care of this patient and I agree with the above. Cam HaiSHAW, KIMBERLY CNM 5:41 PM 07/28/2014

## 2014-08-25 NOTE — L&D Delivery Note (Signed)
Delivery Note At 9:17 PM a viable and healthy female was delivered via Vaginal, Spontaneous Delivery (Presentation: Occiput Anterior). APGAR: 8, 9; weight: pending .   Placenta status: Intact, Spontaneous, to pathology Cord: 3 vessels with the following complications: None.  Cord pH: 7.252  Anesthesia: Epidural  Episiotomy: None Lacerations: None Suture Repair: none Est. Blood Loss (mL): 100  Mom developed a temp of 101.1 during second stage, treated with Tylenol, Amp & Gent.  Mom to postpartum.  Baby to Couplet care / Skin to Skin.  Delivery supervised by Cathie BeamsFran Cresenzo-Dishmon, CNM  Felton Clintonoss,Lisa Wynne 09/28/2014, 9:46 PM   I was present for the delivery and agree wit the above

## 2014-09-07 LAB — OB RESULTS CONSOLE GC/CHLAMYDIA
Chlamydia: NEGATIVE
Gonorrhea: NEGATIVE

## 2014-09-07 LAB — OB RESULTS CONSOLE GBS: STREP GROUP B AG: NEGATIVE

## 2014-09-23 ENCOUNTER — Inpatient Hospital Stay (HOSPITAL_COMMUNITY)
Admission: AD | Admit: 2014-09-23 | Discharge: 2014-09-23 | Disposition: A | Discharge status: Home / Self Care | Payer: Medicaid Other | Source: Ambulatory Visit | Attending: Family Medicine | Admitting: Family Medicine

## 2014-09-23 ENCOUNTER — Encounter (HOSPITAL_COMMUNITY): Payer: Self-pay

## 2014-09-23 DIAGNOSIS — O471 False labor at or after 37 completed weeks of gestation: Secondary | ICD-10-CM | POA: Diagnosis present

## 2014-09-23 DIAGNOSIS — Z3A39 39 weeks gestation of pregnancy: Secondary | ICD-10-CM | POA: Insufficient documentation

## 2014-09-23 NOTE — MAU Note (Signed)
contractions since last night. Stronger tonight. Coming about 10-6515mins apart. Denies LOF or bleeding. Closed last exam

## 2014-09-23 NOTE — Discharge Instructions (Signed)
Third Trimester of Pregnancy °The third trimester is from week 29 through week 42, months 7 through 9. The third trimester is a time when the fetus is growing rapidly. At the end of the ninth month, the fetus is about 20 inches in length and weighs 6-10 pounds.  °BODY CHANGES °Your body goes through many changes during pregnancy. The changes vary from woman to woman.  °· Your weight will continue to increase. You can expect to gain 25-35 pounds (11-16 kg) by the end of the pregnancy. °· You may begin to get stretch marks on your hips, abdomen, and breasts. °· You may urinate more often because the fetus is moving lower into your pelvis and pressing on your bladder. °· You may develop or continue to have heartburn as a result of your pregnancy. °· You may develop constipation because certain hormones are causing the muscles that push waste through your intestines to slow down. °· You may develop hemorrhoids or swollen, bulging veins (varicose veins). °· You may have pelvic pain because of the weight gain and pregnancy hormones relaxing your joints between the bones in your pelvis. Backaches may result from overexertion of the muscles supporting your posture. °· You may have changes in your hair. These can include thickening of your hair, rapid growth, and changes in texture. Some women also have hair loss during or after pregnancy, or hair that feels dry or thin. Your hair will most likely return to normal after your baby is born. °· Your breasts will continue to grow and be tender. A yellow discharge may leak from your breasts called colostrum. °· Your belly button may stick out. °· You may feel short of breath because of your expanding uterus. °· You may notice the fetus "dropping," or moving lower in your abdomen. °· You may have a bloody mucus discharge. This usually occurs a few days to a week before labor begins. °· Your cervix becomes thin and soft (effaced) near your due date. °WHAT TO EXPECT AT YOUR PRENATAL  EXAMS  °You will have prenatal exams every 2 weeks until week 36. Then, you will have weekly prenatal exams. During a routine prenatal visit: °· You will be weighed to make sure you and the fetus are growing normally. °· Your blood pressure is taken. °· Your abdomen will be measured to track your baby's growth. °· The fetal heartbeat will be listened to. °· Any test results from the previous visit will be discussed. °· You may have a cervical check near your due date to see if you have effaced. °At around 36 weeks, your caregiver will check your cervix. At the same time, your caregiver will also perform a test on the secretions of the vaginal tissue. This test is to determine if a type of bacteria, Group B streptococcus, is present. Your caregiver will explain this further. °Your caregiver may ask you: °· What your birth plan is. °· How you are feeling. °· If you are feeling the baby move. °· If you have had any abnormal symptoms, such as leaking fluid, bleeding, severe headaches, or abdominal cramping. °· If you have any questions. °Other tests or screenings that may be performed during your third trimester include: °· Blood tests that check for low iron levels (anemia). °· Fetal testing to check the health, activity level, and growth of the fetus. Testing is done if you have certain medical conditions or if there are problems during the pregnancy. °FALSE LABOR °You may feel small, irregular contractions that   eventually go away. These are called Braxton Hicks contractions, or false labor. Contractions may last for hours, days, or even weeks before true labor sets in. If contractions come at regular intervals, intensify, or become painful, it is best to be seen by your caregiver.  °SIGNS OF LABOR  °· Menstrual-like cramps. °· Contractions that are 5 minutes apart or less. °· Contractions that start on the top of the uterus and spread down to the lower abdomen and back. °· A sense of increased pelvic pressure or back  pain. °· A watery or bloody mucus discharge that comes from the vagina. °If you have any of these signs before the 37th week of pregnancy, call your caregiver right away. You need to go to the hospital to get checked immediately. °HOME CARE INSTRUCTIONS  °· Avoid all smoking, herbs, alcohol, and unprescribed drugs. These chemicals affect the formation and growth of the baby. °· Follow your caregiver's instructions regarding medicine use. There are medicines that are either safe or unsafe to take during pregnancy. °· Exercise only as directed by your caregiver. Experiencing uterine cramps is a good sign to stop exercising. °· Continue to eat regular, healthy meals. °· Wear a good support bra for breast tenderness. °· Do not use hot tubs, steam rooms, or saunas. °· Wear your seat belt at all times when driving. °· Avoid raw meat, uncooked cheese, cat litter boxes, and soil used by cats. These carry germs that can cause birth defects in the baby. °· Take your prenatal vitamins. °· Try taking a stool softener (if your caregiver approves) if you develop constipation. Eat more high-fiber foods, such as fresh vegetables or fruit and whole grains. Drink plenty of fluids to keep your urine clear or pale yellow. °· Take warm sitz baths to soothe any pain or discomfort caused by hemorrhoids. Use hemorrhoid cream if your caregiver approves. °· If you develop varicose veins, wear support hose. Elevate your feet for 15 minutes, 3-4 times a day. Limit salt in your diet. °· Avoid heavy lifting, wear low heal shoes, and practice good posture. °· Rest a lot with your legs elevated if you have leg cramps or low back pain. °· Visit your dentist if you have not gone during your pregnancy. Use a soft toothbrush to brush your teeth and be gentle when you floss. °· A sexual relationship may be continued unless your caregiver directs you otherwise. °· Do not travel far distances unless it is absolutely necessary and only with the approval  of your caregiver. °· Take prenatal classes to understand, practice, and ask questions about the labor and delivery. °· Make a trial run to the hospital. °· Pack your hospital bag. °· Prepare the baby's nursery. °· Continue to go to all your prenatal visits as directed by your caregiver. °SEEK MEDICAL CARE IF: °· You are unsure if you are in labor or if your water has broken. °· You have dizziness. °· You have mild pelvic cramps, pelvic pressure, or nagging pain in your abdominal area. °· You have persistent nausea, vomiting, or diarrhea. °· You have a bad smelling vaginal discharge. °· You have pain with urination. °SEEK IMMEDIATE MEDICAL CARE IF:  °· You have a fever. °· You are leaking fluid from your vagina. °· You have spotting or bleeding from your vagina. °· You have severe abdominal cramping or pain. °· You have rapid weight loss or gain. °· You have shortness of breath with chest pain. °· You notice sudden or extreme swelling   of your face, hands, ankles, feet, or legs. °· You have not felt your baby move in over an hour. °· You have severe headaches that do not go away with medicine. °· You have vision changes. °Document Released: 08/05/2001 Document Revised: 08/16/2013 Document Reviewed: 10/12/2012 °[employer]® Patient Information ©2015 [employer]. This information is not intended to replace advice given to you by your health care provider. Make sure you discuss any questions you have with your health care provider. °Fetal Movement Counts °Patient Name: __________________________________________________ Patient Due Date: ____________________ °Performing a fetal movement count is highly recommended in high-risk pregnancies, but it is good for every pregnant woman to do. Your health care provider may ask you to start counting fetal movements at 28 weeks of the pregnancy. Fetal movements often increase: °· After eating a full meal. °· After physical activity. °· After eating or drinking something sweet or  cold. °· At rest. °Pay attention to when you feel the baby is most active. This will help you notice a pattern of your baby's sleep and wake cycles and what factors contribute to an increase in fetal movement. It is important to perform a fetal movement count at the same time each day when your baby is normally most active.  °HOW TO COUNT FETAL MOVEMENTS °· Find a quiet and comfortable area to sit or lie down on your left side. Lying on your left side provides the best blood and oxygen circulation to your baby. °· Write down the day and time on a sheet of paper or in a journal. °· Start counting kicks, flutters, swishes, rolls, or jabs in a 2-hour period. You should feel at least 10 movements within 2 hours. °· If you do not feel 10 movements in 2 hours, wait 2-3 hours and count again. Look for a change in the pattern or not enough counts in 2 hours. °SEEK MEDICAL CARE IF: °· You feel less than 10 counts in 2 hours, tried twice. °· There is no movement in over an hour. °· The pattern is changing or taking longer each day to reach 10 counts in 2 hours. °· You feel the baby is not moving as he or she usually does. °Date: ____________ Movements: ____________ Start time: ____________ Finish time: ____________  °Date: ____________ Movements: ____________ Start time: ____________ Finish time: ____________ °Date: ____________ Movements: ____________ Start time: ____________ Finish time: ____________ °Date: ____________ Movements: ____________ Start time: ____________ Finish time: ____________ °Date: ____________ Movements: ____________ Start time: ____________ Finish time: ____________ °Date: ____________ Movements: ____________ Start time: ____________ Finish time: ____________ °Date: ____________ Movements: ____________ Start time: ____________ Finish time: ____________ °Date: ____________ Movements: ____________ Start time: ____________ Finish time: ____________  °Date: ____________ Movements: ____________ Start time:  ____________ Finish time: ____________ °Date: ____________ Movements: ____________ Start time: ____________ Finish time: ____________ °Date: ____________ Movements: ____________ Start time: ____________ Finish time: ____________ °Date: ____________ Movements: ____________ Start time: ____________ Finish time: ____________ °Date: ____________ Movements: ____________ Start time: ____________ Finish time: ____________ °Date: ____________ Movements: ____________ Start time: ____________ Finish time: ____________ °Date: ____________ Movements: ____________ Start time: ____________ Finish time: ____________  °Date: ____________ Movements: ____________ Start time: ____________ Finish time: ____________ °Date: ____________ Movements: ____________ Start time: ____________ Finish time: ____________ °Date: ____________ Movements: ____________ Start time: ____________ Finish time: ____________ °Date: ____________ Movements: ____________ Start time: ____________ Finish time: ____________ °Date: ____________ Movements: ____________ Start time: ____________ Finish time: ____________ °Date: ____________ Movements: ____________ Start time: ____________ Finish time: ____________ °Date: ____________ Movements: ____________ Start time: ____________ Finish time:   ____________  °Date: ____________ Movements: ____________ Start time: ____________ Finish time: ____________ °Date: ____________ Movements: ____________ Start time: ____________ Finish time: ____________ °Date: ____________ Movements: ____________ Start time: ____________ Finish time: ____________ °Date: ____________ Movements: ____________ Start time: ____________ Finish time: ____________ °Date: ____________ Movements: ____________ Start time: ____________ Finish time: ____________ °Date: ____________ Movements: ____________ Start time: ____________ Finish time: ____________ °Date: ____________ Movements: ____________ Start time: ____________ Finish time: ____________  °Date:  ____________ Movements: ____________ Start time: ____________ Finish time: ____________ °Date: ____________ Movements: ____________ Start time: ____________ Finish time: ____________ °Date: ____________ Movements: ____________ Start time: ____________ Finish time: ____________ °Date: ____________ Movements: ____________ Start time: ____________ Finish time: ____________ °Date: ____________ Movements: ____________ Start time: ____________ Finish time: ____________ °Date: ____________ Movements: ____________ Start time: ____________ Finish time: ____________ °Date: ____________ Movements: ____________ Start time: ____________ Finish time: ____________  °Date: ____________ Movements: ____________ Start time: ____________ Finish time: ____________ °Date: ____________ Movements: ____________ Start time: ____________ Finish time: ____________ °Date: ____________ Movements: ____________ Start time: ____________ Finish time: ____________ °Date: ____________ Movements: ____________ Start time: ____________ Finish time: ____________ °Date: ____________ Movements: ____________ Start time: ____________ Finish time: ____________ °Date: ____________ Movements: ____________ Start time: ____________ Finish time: ____________ °Date: ____________ Movements: ____________ Start time: ____________ Finish time: ____________  °Date: ____________ Movements: ____________ Start time: ____________ Finish time: ____________ °Date: ____________ Movements: ____________ Start time: ____________ Finish time: ____________ °Date: ____________ Movements: ____________ Start time: ____________ Finish time: ____________ °Date: ____________ Movements: ____________ Start time: ____________ Finish time: ____________ °Date: ____________ Movements: ____________ Start time: ____________ Finish time: ____________ °Date: ____________ Movements: ____________ Start time: ____________ Finish time: ____________ °Date: ____________ Movements: ____________ Start  time: ____________ Finish time: ____________  °Date: ____________ Movements: ____________ Start time: ____________ Finish time: ____________ °Date: ____________ Movements: ____________ Start time: ____________ Finish time: ____________ °Date: ____________ Movements: ____________ Start time: ____________ Finish time: ____________ °Date: ____________ Movements: ____________ Start time: ____________ Finish time: ____________ °Date: ____________ Movements: ____________ Start time: ____________ Finish time: ____________ °Date: ____________ Movements: ____________ Start time: ____________ Finish time: ____________ °Document Released: 09/10/2006 Document Revised: 12/26/2013 Document Reviewed: 06/07/2012 °[employer]® Patient Information ©2015 [employer]. This information is not intended to replace advice given to you by your health care provider. Make sure you discuss any questions you have with your health care provider. °Braxton Hicks Contractions °Contractions of the uterus can occur throughout pregnancy. Contractions are not always a sign that you are in labor.  °WHAT ARE BRAXTON HICKS CONTRACTIONS?  °Contractions that occur before labor are called Braxton Hicks contractions, or false labor. Toward the end of pregnancy (32-34 weeks), these contractions can develop more often and may become more forceful. This is not true labor because these contractions do not result in opening (dilatation) and thinning of the cervix. They are sometimes difficult to tell apart from true labor because these contractions can be forceful and people have different pain tolerances. You should not feel embarrassed if you go to the hospital with false labor. Sometimes, the only way to tell if you are in true labor is for your health care provider to look for changes in the cervix. °If there are no prenatal problems or other health problems associated with the pregnancy, it is completely safe to be sent home with false labor and await the  onset of true labor. °HOW CAN YOU TELL THE DIFFERENCE BETWEEN TRUE AND FALSE LABOR? °False Labor °· The contractions of false labor are usually shorter and not as hard as those of true labor.   °· The contractions   are usually irregular.   °· The contractions are often felt in the front of the lower abdomen and in the groin.   °· The contractions may go away when you walk around or change positions while lying down.   °· The contractions get weaker and are shorter lasting as time goes on.   °· The contractions do not usually become progressively stronger, regular, and closer together as with true labor.   °True Labor °· Contractions in true labor last 30-70 seconds, become very regular, usually become more intense, and increase in frequency.   °· The contractions do not go away with walking.   °· The discomfort is usually felt in the top of the uterus and spreads to the lower abdomen and low back.   °· True labor can be determined by your health care provider with an exam. This will show that the cervix is dilating and getting thinner.   °WHAT TO REMEMBER °· Keep up with your usual exercises and follow other instructions given by your health care provider.   °· Take medicines as directed by your health care provider.   °· Keep your regular prenatal appointments.   °· Eat and drink lightly if you think you are going into labor.   °· If Braxton Hicks contractions are making you uncomfortable:   °· Change your position from lying down or resting to walking, or from walking to resting.   °· Sit and rest in a tub of warm water.   °· Drink 2-3 glasses of water. Dehydration may cause these contractions.   °· Do slow and deep breathing several times an hour.   °WHEN SHOULD I SEEK IMMEDIATE MEDICAL CARE? °Seek immediate medical care if: °· Your contractions become stronger, more regular, and closer together.   °· You have fluid leaking or gushing from your vagina.   °· You have a fever.   °· You pass blood-tinged mucus.    °· You have vaginal bleeding.   °· You have continuous abdominal pain.   °· You have low back pain that you never had before.   °· You feel your baby's head pushing down and causing pelvic pressure.   °· Your baby is not moving as much as it used to.   °Document Released: 08/11/2005 Document Revised: 08/16/2013 Document Reviewed: 05/23/2013 °[employer]® Patient Information ©2015 [employer]. This information is not intended to replace advice given to you by your health care provider. Make sure you discuss any questions you have with your health care provider. ° °

## 2014-09-26 ENCOUNTER — Inpatient Hospital Stay (HOSPITAL_COMMUNITY)
Admission: AD | Admit: 2014-09-26 | Discharge: 2014-09-27 | Disposition: A | Discharge status: Home / Self Care | Payer: Medicaid Other | Source: Ambulatory Visit | Attending: Family Medicine | Admitting: Family Medicine

## 2014-09-26 DIAGNOSIS — O471 False labor at or after 37 completed weeks of gestation: Secondary | ICD-10-CM | POA: Insufficient documentation

## 2014-09-26 DIAGNOSIS — Z3A39 39 weeks gestation of pregnancy: Secondary | ICD-10-CM | POA: Insufficient documentation

## 2014-09-27 ENCOUNTER — Encounter (HOSPITAL_COMMUNITY): Payer: Self-pay | Admitting: *Deleted

## 2014-09-27 ENCOUNTER — Inpatient Hospital Stay (HOSPITAL_COMMUNITY)
Admission: AD | Admit: 2014-09-27 | Discharge: 2014-09-28 | Disposition: A | Discharge status: Home / Self Care | Payer: Medicaid Other | Source: Ambulatory Visit | Attending: Obstetrics & Gynecology | Admitting: Obstetrics & Gynecology

## 2014-09-27 NOTE — MAU Note (Signed)
Pt states she has been having contractions since Friday but contractions today are stronger and closer together

## 2014-09-28 ENCOUNTER — Encounter (HOSPITAL_COMMUNITY): Payer: Self-pay | Admitting: *Deleted

## 2014-09-28 ENCOUNTER — Inpatient Hospital Stay (HOSPITAL_COMMUNITY): Payer: Medicaid Other | Admitting: Anesthesiology

## 2014-09-28 ENCOUNTER — Inpatient Hospital Stay (HOSPITAL_COMMUNITY)
Admission: AD | Admit: 2014-09-28 | Discharge: 2014-09-30 | DRG: 774 | Disposition: A | Discharge status: Home / Self Care | Payer: Medicaid Other | Source: Ambulatory Visit | Attending: Family Medicine | Admitting: Family Medicine

## 2014-09-28 DIAGNOSIS — Z3A39 39 weeks gestation of pregnancy: Secondary | ICD-10-CM | POA: Diagnosis present

## 2014-09-28 DIAGNOSIS — Z8619 Personal history of other infectious and parasitic diseases: Secondary | ICD-10-CM | POA: Diagnosis not present

## 2014-09-28 DIAGNOSIS — IMO0001 Reserved for inherently not codable concepts without codable children: Secondary | ICD-10-CM

## 2014-09-28 DIAGNOSIS — Z8249 Family history of ischemic heart disease and other diseases of the circulatory system: Secondary | ICD-10-CM

## 2014-09-28 DIAGNOSIS — O411230 Chorioamnionitis, third trimester, not applicable or unspecified: Secondary | ICD-10-CM | POA: Diagnosis present

## 2014-09-28 DIAGNOSIS — B029 Zoster without complications: Secondary | ICD-10-CM

## 2014-09-28 DIAGNOSIS — O9852 Other viral diseases complicating childbirth: Secondary | ICD-10-CM | POA: Diagnosis present

## 2014-09-28 LAB — CBC
HCT: 40.5 % (ref 36.0–46.0)
Hemoglobin: 13.6 g/dL (ref 12.0–15.0)
MCH: 28.3 pg (ref 26.0–34.0)
MCHC: 33.6 g/dL (ref 30.0–36.0)
MCV: 84.4 fL (ref 78.0–100.0)
PLATELETS: 191 10*3/uL (ref 150–400)
RBC: 4.8 MIL/uL (ref 3.87–5.11)
RDW: 14.3 % (ref 11.5–15.5)
WBC: 13.6 10*3/uL — ABNORMAL HIGH (ref 4.0–10.5)

## 2014-09-28 LAB — TYPE AND SCREEN
ABO/RH(D): A POS
Antibody Screen: NEGATIVE

## 2014-09-28 LAB — ABO/RH: ABO/RH(D): A POS

## 2014-09-28 MED ORDER — ZOLPIDEM TARTRATE 5 MG PO TABS
5.0000 mg | ORAL_TABLET | Freq: Once | ORAL | Status: AC
  Administered 2014-09-28: 5 mg via ORAL
  Filled 2014-09-28: qty 1

## 2014-09-28 MED ORDER — PRENATAL PLUS 27-1 MG PO TABS: 1.0000 | ORAL_TABLET | Freq: Every day | ORAL | Status: DC

## 2014-09-28 MED ORDER — METHYLERGONOVINE MALEATE 0.2 MG PO TABS: 0.2000 mg | ORAL_TABLET | ORAL | Status: DC | PRN

## 2014-09-28 MED ORDER — ACETAMINOPHEN 500 MG PO TABS
1000.0000 mg | ORAL_TABLET | Freq: Four times a day (QID) | ORAL | Status: DC | PRN
  Administered 2014-09-28: 1000 mg via ORAL
  Filled 2014-09-28: qty 2

## 2014-09-28 MED ORDER — TETANUS-DIPHTH-ACELL PERTUSSIS 5-2.5-18.5 LF-MCG/0.5 IM SUSP: 0.5000 mL | Freq: Once | INTRAMUSCULAR | Status: DC

## 2014-09-28 MED ORDER — EPHEDRINE 5 MG/ML INJ
10.0000 mg | INTRAVENOUS | Status: DC | PRN
  Filled 2014-09-28: qty 2

## 2014-09-28 MED ORDER — IBUPROFEN 600 MG PO TABS
600.0000 mg | ORAL_TABLET | Freq: Four times a day (QID) | ORAL | Status: DC
  Administered 2014-09-29 – 2014-09-30 (×7): 600 mg via ORAL
  Filled 2014-09-28 (×8): qty 1

## 2014-09-28 MED ORDER — FERROUS SULFATE 325 (65 FE) MG PO TABS
325.0000 mg | ORAL_TABLET | Freq: Two times a day (BID) | ORAL | Status: DC
  Administered 2014-09-29 – 2014-09-30 (×3): 325 mg via ORAL
  Filled 2014-09-28 (×3): qty 1

## 2014-09-28 MED ORDER — OXYCODONE-ACETAMINOPHEN 5-325 MG PO TABS
1.0000 | ORAL_TABLET | ORAL | Status: DC | PRN
  Administered 2014-09-29 – 2014-09-30 (×3): 1 via ORAL
  Filled 2014-09-28 (×4): qty 1

## 2014-09-28 MED ORDER — VALACYCLOVIR HCL 500 MG PO TABS
500.0000 mg | ORAL_TABLET | Freq: Two times a day (BID) | ORAL | Status: DC
  Filled 2014-09-28 (×3): qty 1

## 2014-09-28 MED ORDER — LANOLIN HYDROUS EX OINT: TOPICAL_OINTMENT | CUTANEOUS | Status: DC | PRN

## 2014-09-28 MED ORDER — SODIUM CHLORIDE 0.9 % IV SOLN
2.0000 g | Freq: Once | INTRAVENOUS | Status: AC
  Administered 2014-09-28: 2 g via INTRAVENOUS
  Filled 2014-09-28: qty 2000

## 2014-09-28 MED ORDER — TERBUTALINE SULFATE 1 MG/ML IJ SOLN
0.2500 mg | Freq: Once | INTRAMUSCULAR | Status: DC | PRN
  Filled 2014-09-28: qty 1

## 2014-09-28 MED ORDER — ONDANSETRON HCL 4 MG/2ML IJ SOLN: 4.0000 mg | Freq: Four times a day (QID) | INTRAMUSCULAR | Status: DC | PRN

## 2014-09-28 MED ORDER — GENTAMICIN SULFATE 40 MG/ML IJ SOLN
160.0000 mg | Freq: Three times a day (TID) | INTRAVENOUS | Status: DC
  Administered 2014-09-28: 160 mg via INTRAVENOUS
  Filled 2014-09-28 (×2): qty 4

## 2014-09-28 MED ORDER — PHENYLEPHRINE 40 MCG/ML (10ML) SYRINGE FOR IV PUSH (FOR BLOOD PRESSURE SUPPORT)
80.0000 ug | PREFILLED_SYRINGE | INTRAVENOUS | Status: DC | PRN
  Administered 2014-09-28: 80 ug via INTRAVENOUS
  Filled 2014-09-28: qty 2

## 2014-09-28 MED ORDER — FLEET ENEMA 7-19 GM/118ML RE ENEM: 1.0000 | ENEMA | Freq: Every day | RECTAL | Status: DC | PRN

## 2014-09-28 MED ORDER — LACTATED RINGERS IV SOLN
500.0000 mL | Freq: Once | INTRAVENOUS | Status: AC
  Administered 2014-09-28: 500 mL via INTRAVENOUS

## 2014-09-28 MED ORDER — FLEET ENEMA 7-19 GM/118ML RE ENEM: 1.0000 | ENEMA | RECTAL | Status: DC | PRN

## 2014-09-28 MED ORDER — PHENYLEPHRINE 40 MCG/ML (10ML) SYRINGE FOR IV PUSH (FOR BLOOD PRESSURE SUPPORT)
80.0000 ug | PREFILLED_SYRINGE | INTRAVENOUS | Status: DC | PRN
  Filled 2014-09-28: qty 20
  Filled 2014-09-28: qty 2

## 2014-09-28 MED ORDER — METHYLERGONOVINE MALEATE 0.2 MG/ML IJ SOLN: 0.2000 mg | INTRAMUSCULAR | Status: DC | PRN

## 2014-09-28 MED ORDER — BENZOCAINE-MENTHOL 20-0.5 % EX AERO: 1.0000 "application " | INHALATION_SPRAY | CUTANEOUS | Status: DC | PRN

## 2014-09-28 MED ORDER — OXYTOCIN 40 UNITS IN LACTATED RINGERS INFUSION - SIMPLE MED
1.0000 m[IU]/min | INTRAVENOUS | Status: DC
  Administered 2014-09-28: 2 m[IU]/min via INTRAVENOUS

## 2014-09-28 MED ORDER — CITRIC ACID-SODIUM CITRATE 334-500 MG/5ML PO SOLN: 30.0000 mL | ORAL | Status: DC | PRN

## 2014-09-28 MED ORDER — ZOLPIDEM TARTRATE 5 MG PO TABS: 5.0000 mg | ORAL_TABLET | Freq: Every evening | ORAL | Status: DC | PRN

## 2014-09-28 MED ORDER — LIDOCAINE HCL (PF) 1 % IJ SOLN
30.0000 mL | INTRAMUSCULAR | Status: DC | PRN
  Filled 2014-09-28: qty 30

## 2014-09-28 MED ORDER — BISACODYL 10 MG RE SUPP
10.0000 mg | Freq: Every day | RECTAL | Status: DC | PRN
Start: 2014-09-28 — End: 2014-09-30

## 2014-09-28 MED ORDER — OXYCODONE-ACETAMINOPHEN 5-325 MG PO TABS: 2.0000 | ORAL_TABLET | ORAL | Status: DC | PRN

## 2014-09-28 MED ORDER — WITCH HAZEL-GLYCERIN EX PADS: 1.0000 "application " | MEDICATED_PAD | CUTANEOUS | Status: DC | PRN

## 2014-09-28 MED ORDER — OXYTOCIN BOLUS FROM INFUSION
500.0000 mL | INTRAVENOUS | Status: DC
  Administered 2014-09-28: 500 mL via INTRAVENOUS

## 2014-09-28 MED ORDER — LACTATED RINGERS IV SOLN
500.0000 mL | INTRAVENOUS | Status: DC | PRN
  Administered 2014-09-28: 500 mL via INTRAVENOUS

## 2014-09-28 MED ORDER — OXYTOCIN 40 UNITS IN LACTATED RINGERS INFUSION - SIMPLE MED
62.5000 mL/h | INTRAVENOUS | Status: DC
  Filled 2014-09-28: qty 1000

## 2014-09-28 MED ORDER — FENTANYL 2.5 MCG/ML BUPIVACAINE 1/10 % EPIDURAL INFUSION (WH - ANES)
14.0000 mL/h | INTRAMUSCULAR | Status: DC | PRN
  Filled 2014-09-28: qty 125

## 2014-09-28 MED ORDER — OXYCODONE-ACETAMINOPHEN 5-325 MG PO TABS: 1.0000 | ORAL_TABLET | ORAL | Status: DC | PRN

## 2014-09-28 MED ORDER — SIMETHICONE 80 MG PO CHEW: 80.0000 mg | CHEWABLE_TABLET | ORAL | Status: DC | PRN

## 2014-09-28 MED ORDER — DIBUCAINE 1 % RE OINT: 1.0000 "application " | TOPICAL_OINTMENT | RECTAL | Status: DC | PRN

## 2014-09-28 MED ORDER — SENNOSIDES-DOCUSATE SODIUM 8.6-50 MG PO TABS
2.0000 | ORAL_TABLET | ORAL | Status: DC
  Administered 2014-09-29: 2 via ORAL
  Administered 2014-09-30: 1 via ORAL
  Filled 2014-09-28 (×2): qty 2

## 2014-09-28 MED ORDER — DIPHENHYDRAMINE HCL 25 MG PO CAPS: 25.0000 mg | ORAL_CAPSULE | Freq: Four times a day (QID) | ORAL | Status: DC | PRN

## 2014-09-28 MED ORDER — PRENATAL MULTIVITAMIN CH
1.0000 | ORAL_TABLET | Freq: Every day | ORAL | Status: DC
  Administered 2014-09-29 – 2014-09-30 (×2): 1 via ORAL
  Filled 2014-09-28 (×2): qty 1

## 2014-09-28 MED ORDER — MEASLES, MUMPS & RUBELLA VAC ~~LOC~~ INJ
0.5000 mL | INJECTION | Freq: Once | SUBCUTANEOUS | Status: DC
  Filled 2014-09-28: qty 0.5

## 2014-09-28 MED ORDER — METHYLERGONOVINE MALEATE 0.2 MG/ML IJ SOLN
0.2000 mg | Freq: Once | INTRAMUSCULAR | Status: AC
  Administered 2014-09-28: 0.2 mg via INTRAMUSCULAR

## 2014-09-28 MED ORDER — DIPHENHYDRAMINE HCL 50 MG/ML IJ SOLN: 12.5000 mg | INTRAMUSCULAR | Status: DC | PRN

## 2014-09-28 MED ORDER — ONDANSETRON HCL 4 MG PO TABS: 4.0000 mg | ORAL_TABLET | ORAL | Status: DC | PRN

## 2014-09-28 MED ORDER — ONDANSETRON HCL 4 MG/2ML IJ SOLN: 4.0000 mg | INTRAMUSCULAR | Status: DC | PRN

## 2014-09-28 MED ORDER — OXYTOCIN 40 UNITS IN LACTATED RINGERS INFUSION - SIMPLE MED: 62.5000 mL/h | INTRAVENOUS | Status: DC | PRN

## 2014-09-28 MED ORDER — ACETAMINOPHEN 325 MG PO TABS: 650.0000 mg | ORAL_TABLET | ORAL | Status: DC | PRN

## 2014-09-28 MED ORDER — FENTANYL 2.5 MCG/ML BUPIVACAINE 1/10 % EPIDURAL INFUSION (WH - ANES)
INTRAMUSCULAR | Status: DC | PRN
  Administered 2014-09-28: 14 mL/h via EPIDURAL

## 2014-09-28 MED ORDER — LACTATED RINGERS IV SOLN
INTRAVENOUS | Status: DC
  Administered 2014-09-28 (×3): via INTRAVENOUS

## 2014-09-28 MED ORDER — OXYCODONE-ACETAMINOPHEN 5-325 MG PO TABS
2.0000 | ORAL_TABLET | ORAL | Status: DC | PRN
  Administered 2014-09-29 – 2014-09-30 (×2): 2 via ORAL
  Filled 2014-09-28 (×2): qty 2

## 2014-09-28 MED ORDER — LIDOCAINE HCL (PF) 1 % IJ SOLN
INTRAMUSCULAR | Status: DC | PRN
  Administered 2014-09-28 (×2): 8 mL

## 2014-09-28 NOTE — Progress Notes (Signed)
Alyssa Mcbride is a 24 y.o. G2P0010 at 1967w6d by ultrasound admitted for active labor, rupture of membranes  Subjective: Patient laying comfortably in bed. States she feels much better since being given the epidural. Discomfort is gone and patient has been able to sleep. Inquired about the policy w/ her diet. I informed her she would have to limit herself to our thin liquid diet until delivery due to the possibility of Cesarean. Patient and mother very grateful for our care and excited to meet baby!  Objective: BP 104/53 mmHg  Pulse 90  Temp(Src) 98 F (36.7 C) (Oral)  Resp 20  Ht 5\' 4"  (1.626 m)  Wt 78.926 kg (174 lb)  BMI 29.85 kg/m2  SpO2 100%  LMP 12/05/2013      FHT:  FHR: 135 bpm, variability: moderate,  accelerations:  Present,  decelerations:  Absent UC:   regular, every 7 minutes SVE:   Dilation: 6.5 Effacement (%): 90 Station: -1 Exam by:: J.Cox, RN  Labs: Lab Results  Component Value Date   WBC 13.6* 09/28/2014   HGB 13.6 09/28/2014   HCT 40.5 09/28/2014   MCV 84.4 09/28/2014   PLT 191 09/28/2014    Assessment / Plan: Spontaneous labor, progressing normally   - may require Pitocin if contraction frequency does not increase.  Labor: Progressing normally Preeclampsia:  labs stable Fetal Wellbeing:  Category I Pain Control:  Epidural I/D:  GBS neg Anticipated MOD:  NSVD  Kathee DeltonMcKeag, Ian D 09/28/2014, 4:29 PM

## 2014-09-28 NOTE — Progress Notes (Signed)
ANTIBIOTIC CONSULT NOTE - INITIAL  Pharmacy Consult for Gentamicin Indication: Chorioamnionitis  No Known Allergies  Patient Measurements: Height: 5\' 4"  (162.6 cm) Weight: 174 lb (78.926 kg) IBW/kg (Calculated) : 54.7 Adjusted Body Weight: 62 kg  Vital Signs: Temp: 101.4 F (38.6 C) (02/04 2030) Temp Source: Axillary (02/04 2030) BP: 117/61 mmHg (02/04 2100) Pulse Rate: 137 (02/04 2100) Total I/O In: -  Out: 600 [Urine:600]  Labs:  Recent Labs  09/28/14 1240  WBC 13.6*  HGB 13.6  PLT 191   Estimated Scr = 0.68 with estimated CrCl > 13500ml/min.  Microbiology: Recent Results (from the past 720 hour(s))  OB RESULT CONSOLE Group B Strep     Status: None   Collection Time: 09/07/14 12:00 AM  Result Value Ref Range Status   GBS Negative  Final    Medical History: Past Medical History  Diagnosis Date  . BV (bacterial vaginosis)   . 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 Pt has now developed maternal temp during labor. Ampicillin and Gentamicin initiated for chorioamnionitis.  Goal of Therapy:  Gentamicin peaks 6-388mcg/ml and trough < 1001mcg/ml  Plan:  1. Gentamicin 160mg  IV q8h. 2. Will plan to draw SCr if continued postpartum. 3. Will continue to follow and assess need for further kinetic workup Thanks!  Claybon Jabsngel, Ashira G 09/28/2014,9:14 PM

## 2014-09-28 NOTE — Progress Notes (Signed)
Patient ID: Alyssa Mcbride, female   DOB: 08-19-1991, 24 y.o.   MRN: [id number]   SUBJECTIVE: Called to see patient for increased bleeding/clots.  Has not saturated a whole pad, but has a constant trickle, and has passed two small clots.  OBJECTIVE: Continuous oozing, small amount.  Uterus explored, no large clots.  Uterus boggy, firms briefly with massage.  Perineum/cervix intact.  ASSESSMENT: Uterine atony  PLAN: Methergine 0.2 mg IM given Fundus firm now, bleeding decreased Continue pitocin infusion until current litre complete   Alyssa Mcbride, SNM I have seen and examined this patient and agree the above assessment. CRESENZO-DISHMAN,Alyssa Mcbride 09/29/2014 7:36 AM

## 2014-09-28 NOTE — Anesthesia Preprocedure Evaluation (Signed)

## 2014-09-28 NOTE — Progress Notes (Addendum)
   Alyssa Mcbride is a 24 y.o. G2P0010 at 236w6d  admitted for active labor  Subjective:  Doesn't feel much pressure. Would rather push now  Objective: Filed Vitals:   09/28/14 1900 09/28/14 1930 09/28/14 2000 09/28/14 2030  BP: 113/78 110/68 110/49 127/68  Pulse: 112 109 126 139  Temp: 99.8 F (37.7 C) 99.4 F (37.4 C)  101.4 F (38.6 C)  TempSrc: Oral Oral  Axillary  Resp:      Height:      Weight:      SpO2:       Total I/O In: -  Out: 350 [Urine:350]  FHT:  FHR: 160 bpm, variability: moderate,  accelerations:  Present,  decelerations:  Absent UC:   regular, every 2 minutes SVE:   Dilation: 10 Effacement (%): 100 Station: +1 Exam by:: Cresenzo- Dishmon   Labs: Lab Results  Component Value Date   WBC 13.6* 09/28/2014   HGB 13.6 09/28/2014   HCT 40.5 09/28/2014   MCV 84.4 09/28/2014   PLT 191 09/28/2014    Assessment / Plan: Spontaneous labor, progressing normally  Chorioamnionitis:  1gm Tylenol po now; amp/gent Due to chorio and the fact that pt seems to push well, will go ahead and start pushing.  If she does not progress, will turn down epidural Labor: Progressing normally Fetal Wellbeing:  Category I Pain Control:  Epidural Anticipated MOD:  NSVD  CRESENZO-DISHMAN,Arthea Nobel 09/28/2014, 8:42 PM

## 2014-09-28 NOTE — H&P (Signed)
Chief Complaint:  Labor Eval   First Provider Initiated Contact with Patient 09/28/14 1158     HPI: Reubin Milanndrea Lorino is a 24 y.o. G2P0010 at 7225w6d who presents to maternity admissions reporting increased frequency and discomfort w/ contractions. Contractions have been ongoing since this past Friday. Patient was seen lastnight in the MAU for the same complaints. She was found to be 2cm dilated. Today she is 4cm dilated. Patient is unable to speak through contractions. Denies vaginal bleeding or fluid leakage. Good fetal movement.  Patient's membranes ruptured during cervical exam in MAU today (@1215 ). Patient seen for prenatal care at [facility].  Of Note: patient has a history of "HSV" listed. Upon further inquiry patient has never had any vaginal lesions in the past. The "Herpes" she described was a skin lesion on the skin over her sacrum and buttock. This outbreak occurred only once, during a time of significant stress. 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 I will continue patient's Valtrex for now. I will look further into patient's chart for more information on this past encounter.  Past Medical History: Past Medical History  Diagnosis Date  . BV (bacterial vaginosis)   . 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 No pertinent past surgical history.   Family History: Family History  Problem Relation Age of Onset  . Heart attack Neg Hx   . Hypertension Mother     Social History: History  Substance Use Topics  . Smoking status: Never Smoker   . Smokeless tobacco: Not on file  . Alcohol Use: Yes   Comment: LAST DRANK-  01-14-2014    Allergies: No Known Allergies  Meds:  Prescriptions prior to admission  Medication Sig Dispense Refill Last Dose  . Acetaminophen (TYLENOL PO) Take 1 tablet by mouth every 6 (six) hours as needed (pain).   09/27/2014 at Unknown time  . Prenatal Vit-Fe Fumarate-FA (MULTIVITAMIN-PRENATAL) 27-0.8 MG TABS tablet Take 1 tablet by mouth daily.    09/27/2014 at Unknown time  . valACYclovir (VALTREX) 500 MG tablet Take 500 mg by mouth 2 (two) times daily.   09/27/2014 at Unknown time    ROS: Pertinent findings in history of present illness.  Physical Exam  Blood pressure 124/84, pulse 92, temperature 97.5 F (36.4 C), temperature source Oral, resp. rate 20, last menstrual period 12/05/2013, unknown if currently breastfeeding. GENERAL: Well-developed, well-nourished female in no acute distress.  HEENT: normocephalic, EOMI HEART: normal rate, no murmur RESP: normal effort, CTAB ABDOMEN: Soft, non-tender, gravid appropriate for gestational age EXTREMITIES: Nontender, no edema, pulses present and equal bilaterally NEURO: alert and oriented  Dilation: 4.5 Effacement (%): 90 Cervical Position: Middle Station: -1 Presentation: Vertex Exam by:: L. Clemmons CNM  FHT:  Baseline 140, moderate variability, accelerations present, no decelerations Contractions: q 7 mins   Labs: No results found for this or any previous visit (from the past 24 hour(s)).  Imaging:  No results found.  Assessment:  1. Labor: active; SROM  2. Fetal Wellbeing: Category 1  3. Pain Control: epidural 4. GBS: neg 5. 39.6 week IUP  Plan:  1. Admit to BS per consult with MD 2. Routine L&D orders 3. Analgesia/anesthesia PRN      Medication List    ASK your doctor about these medications        multivitamin-prenatal 27-0.8 MG Tabs tablet  Take 1 tablet by mouth daily.     TYLENOL PO  Take 1 tablet by mouth every 6 (six) hours as needed (pain).     valACYclovir 500 MG  tablet  Commonly known as:  VALTREX  Take 500 mg by mouth 2 (two) times daily.        Kathee Delton, MD 09/28/2014 12:28 PM  Pt had positive PCR for HSV II  CNM attestation:  I have seen and examined this patient; I agree with above documentation in the Residents's note.   Essa Wenk is a 24 y.o. G2P0010 reporting pain with contractions +FM, denies LOF, VB, contractions, vaginal discharge.  PE: BP 94/62 mmHg  Pulse 89  Temp(Src) 97.9 F (36.6 C) (Oral)  Resp 20  Ht  (1.626 m)  Wt 78.926 kg (174 lb)  BMI 29.85 kg/m2  SpO2 100%  LMP 12/05/2013 Gen: calm comfortable, NAD Resp: normal effort, no distress Abd: gravid  ROS, labs, PMH reviewed NST reactive Cat 1 SROM Clear Fluid on vag exam at 1215 4-5/90/-1 Plan: Admit L/D Anticipate Vaginal C/S   Clemmons,Lori Grissett, CNM 3:04 PM

## 2014-09-28 NOTE — Progress Notes (Signed)
Alyssa Mcbride is a 24 y.o. G2P0010 at 591w6d by ultrasound admitted for rupture of membranes  Subjective:  Doing well; Comfortable with epidural.  Objective: BP 113/70 mmHg  Pulse 97  Temp(Src) 98 F (36.7 C) (Oral)  Resp 20  Ht 5\' 4"  (1.626 m)  Wt 78.926 kg (174 lb)  BMI 29.85 kg/m2  SpO2 100%  LMP 12/05/2013      FHT:145 baseline Cat 1 UC:  q 4-5 min SVE:   Dilation: 10 Effacement (%): 90 Station: -1 Exam by:: Eppie Barhorst, CNM  Labs: Lab Results  Component Value Date   WBC 13.6* 09/28/2014   HGB 13.6 09/28/2014   HCT 40.5 09/28/2014   MCV 84.4 09/28/2014   PLT 191 09/28/2014    Assessment / Plan: Spontaneous labor, progressing normally  Labor: Progressing normally Preeclampsia:  n/a Fetal Wellbeing:  Category I Pain Control:  Epidural I/D:  n/a Anticipated MOD:  NSVD  Alyssa Mcbride 09/28/2014, 5:54 PM

## 2014-09-28 NOTE — MAU Note (Signed)
Pt states she was seen in MAU last night, was 2 cm's, sent home.  States uc's are more intense & painful, denies bleeding or LOF.

## 2014-09-28 NOTE — Anesthesia Procedure Notes (Signed)
Epidural Patient location during procedure: OB Start time: 09/28/2014 1:23 PM End time: 09/28/2014 1:27 PM  Staffing Anesthesiologist: Leilani AbleHATCHETT, Aaidyn San Performed by: anesthesiologist   Preanesthetic Checklist Completed: patient identified, surgical consent, pre-op evaluation, timeout performed, IV checked, risks and benefits discussed and monitors and equipment checked  Epidural Patient position: sitting Prep: site prepped and draped and DuraPrep Patient monitoring: continuous pulse ox and blood pressure Approach: midline Location: L3-L4 Injection technique: LOR air  Needle:  Needle type: Tuohy  Needle gauge: 17 G Needle length: 9 cm and 9 Needle insertion depth: 6 cm Catheter type: closed end flexible Catheter size: 19 Gauge Catheter at skin depth: 11 cm Test dose: negative and Other  Assessment Sensory level: T9 Events: blood not aspirated, injection not painful, no injection resistance, negative IV test and no paresthesia

## 2014-09-29 LAB — RPR: RPR: NONREACTIVE

## 2014-09-29 NOTE — Progress Notes (Signed)
Post Partum Day #1 Subjective: no complaints, up ad lib, voiding, tolerating PO and + flatus  Objective: Blood pressure 115/71, pulse 80, temperature 98 F (36.7 C), temperature source Oral, resp. rate 18, height 5\' 4"  (1.626 m), weight 78.926 kg (174 lb), last menstrual period 12/05/2013, SpO2 100 %, unknown if currently breastfeeding.  Physical Exam:  General: alert, cooperative, fatigued, no distress and happy Lochia: appropriate Uterine Fundus: firm @U  Incision: n/a DVT Evaluation: No evidence of DVT seen on physical exam. Negative Homan's sign. No cords or calf tenderness. No significant calf/ankle edema.   Recent Labs  09/28/14 1240  HGB 13.6  HCT 40.5    Assessment/Plan: Plan for discharge tomorrow, Breastfeeding and Contraception Nexplanon   LOS: 1 day   Ross,Lisa Wynne 09/29/2014, 6:41 AM   I have seen and examined this patient and agree the above assessment. CRESENZO-DISHMAN,Pearce Littlefield 09/29/2014 7:36 AM

## 2014-09-29 NOTE — Anesthesia Postprocedure Evaluation (Signed)
Anesthesia Post Note  Patient: Alyssa Mcbride  Procedure(s) Performed: * No procedures listed *  Anesthesia type: Epidural  Patient location: Mother/Baby  Post pain: Pain level controlled  Post assessment: Post-op Vital signs reviewed  Last Vitals:  Filed Vitals:   09/29/14 0520  BP: 115/71  Pulse: 80  Temp: 36.7 C  Resp: 18    Post vital signs: Reviewed  Level of consciousness:alert  Complications: No apparent anesthesia complications

## 2014-09-29 NOTE — Progress Notes (Signed)
UR chart review completed.  

## 2014-09-29 NOTE — Lactation Note (Signed)
This note was copied from the chart of Alyssa Mcbride Arons. Lactation Consultation Note  Patient Name: Alyssa Mcbride Christon ZOXWR'UToday's Date: 09/29/2014 Reason for consult: Initial assessment  Initial visit with Mom when baby 3916 hrs old.  Mom states baby just fed for 15 mins.  He is now sleeping in his crib.  Offered her to call for feeding observation at next feeding.  Reviewed basics with Mom, and importance of support when baby is breast feeding.  Encouraged skin to skin, and cue based feedings.  Brochure left in room.  Informed Mom of IP and OP lactation services available to her.  Instructed Mom of follow up in am, but to call her RN for help as needed.                     Consult Status Consult Status: Follow-up Date: 09/30/14 Follow-up type: In-patient    Judee ClaraSmith, Amerika Nourse E 09/29/2014, 2:05 PM

## 2014-09-30 NOTE — Discharge Summary (Signed)
Patient is 24 y.o. [id number] 4838w6d presented for SOL. Delivered baby boy via SVD. Developed chorio during 2nd stage, received amp and getn. Has been afebrile for at least 24 hours. Breastfeeding and nexplanon.   Obstetric Discharge Summary Reason for Admission: onset of labor Prenatal Procedures: none Intrapartum Procedures: spontaneous vaginal delivery Postpartum Procedures: none Complications-Operative and Postpartum: none HEMOGLOBIN  Date Value Ref Range Status  09/28/2014 13.6 12.0 - 15.0 g/dL Final   HCT  Date Value Ref Range Status  09/28/2014 40.5 36.0 - 46.0 % Final    Physical Exam:  General: alert, cooperative and no distress Lochia: appropriate Uterine Fundus: firm Incision: na DVT Evaluation: No evidence of DVT seen on physical exam.  Discharge Diagnoses: Term Pregnancy-delivered  Discharge Information: Date: 09/30/2014 Activity: unrestricted Diet: routine Medications: None Condition: stable Instructions: see attachment Discharge to: home Follow-up Information    [facility].   Specialty:  Home Health Services   Contact information:   [facility] [address] [place] [address] [phone]       Newborn Data: Live born female  Birth Weight: 7 lb 10.9 oz (3484 g) APGAR: 8, 9  Home with mother.  Rolm BookbinderMoss, Amber 09/30/2014, 7:20 AM   I have seen this patient, personally examined her, and agree with the above resident's note.  LEFTWICH-KIRBY, Christeena Krogh Certified Nurse-Midwife

## 2014-10-01 ENCOUNTER — Ambulatory Visit: Payer: Self-pay

## 2014-10-01 NOTE — Lactation Note (Signed)
This note was copied from the chart of Alyssa Mcbride. Lactation Consultation Note  Patient Name: Alyssa Mcbride ZOXWR'UToday's Date: 10/01/2014  baby and mom ready for discharge . Mom has concerned baby isn't getting enough at breast . Doc flow sheets WNL  LC assessed latch on both breast and reviewed basics and the importance of depth at the breast. Mom has steady flow of colostrum , multiply swallows noted, increased with breast compressions. Per mom nipples tender, no breakdown noted, comfort gels with instruction and hand pump. Increased flange to #27  Just incase mom needs it when milk comes in. Sore nipple and engorgement prevention and tx reviewed. Per mom active with [facility] [place]. Per mom plans to go today and stay with her mom in [place] , [place], coming back for dr. Boneta LucksApt Tuesday with Dr. Ane PaymentHooker. Due to limited breast changes in pregnancy LC recommended by 4-5 days if breast are fuller and milk isn't in to [facility]  Loaner and LC would [facility]. Mom aware. Also to go ahead and start Fenugreek or Mothers Love herbs. Also if baby doesn't seemed satisfied ore wet diapers and stools decrease is to supplement after baby has been to the both breast  Following the supplementing guidelines.    Maternal Data    Feeding    LATCH Score/Interventions                      Lactation Tools Discussed/Used     Consult Status      Kathrin Greathouseorio, Mane Consolo Ann 10/01/2014, 7:55 AM

## 2015-05-09 ENCOUNTER — Other Ambulatory Visit: Payer: Self-pay | Admitting: Obstetrics & Gynecology

## 2015-05-10 ENCOUNTER — Other Ambulatory Visit: Payer: Self-pay | Admitting: Obstetrics & Gynecology

## 2015-11-04 IMAGING — US US MFM FETAL NUCHAL TRANSLUCENCY
1 series · 13 of 26 positions shown · non-contrast
Comparison: none

[Series 1: us mfm fetal nuchal translucency · 13 of 26 slices shown]
[im 2/26]
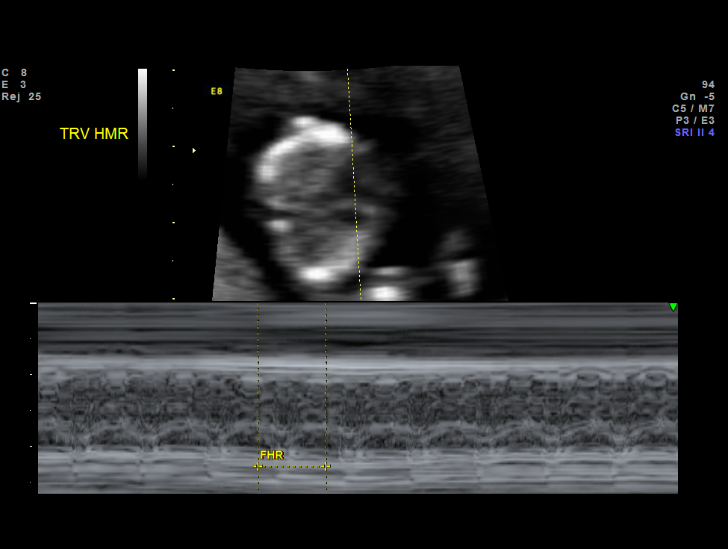
[im 4/26]
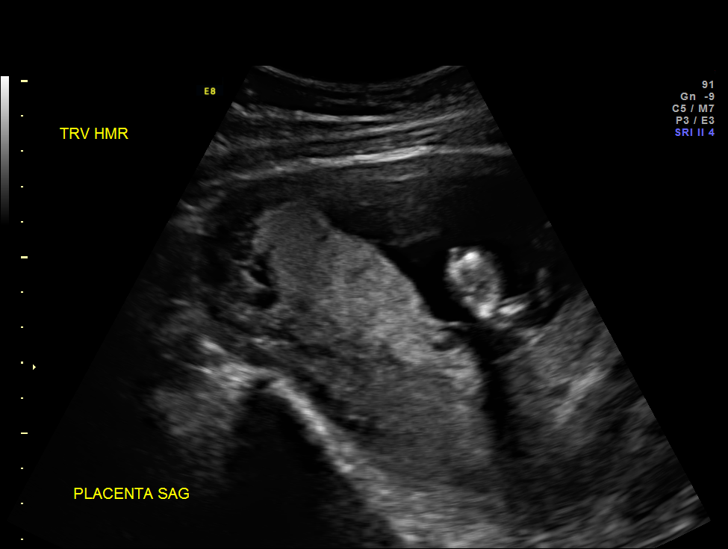
[im 6/26]
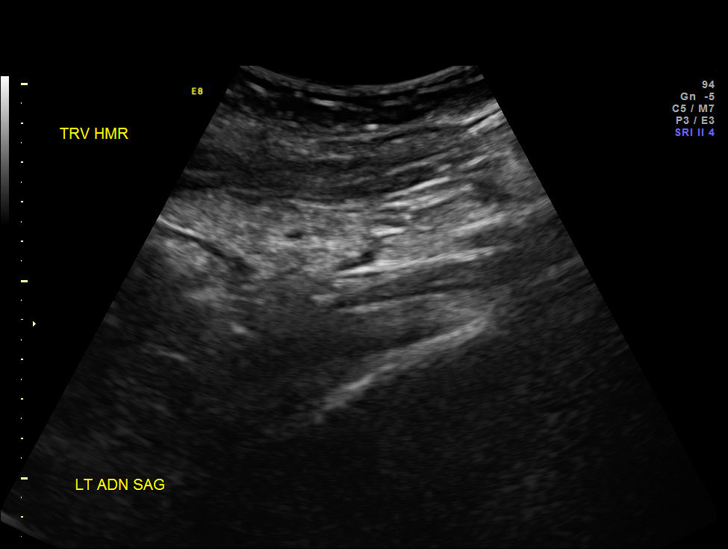
[im 8/26]
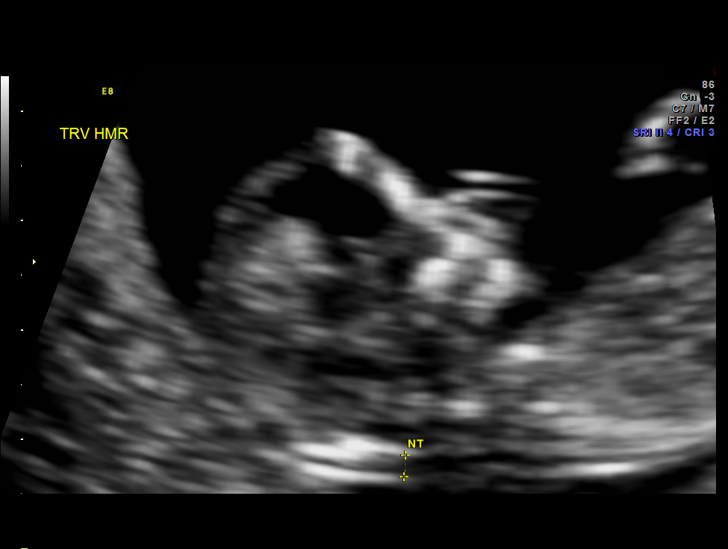
[im 10/26]
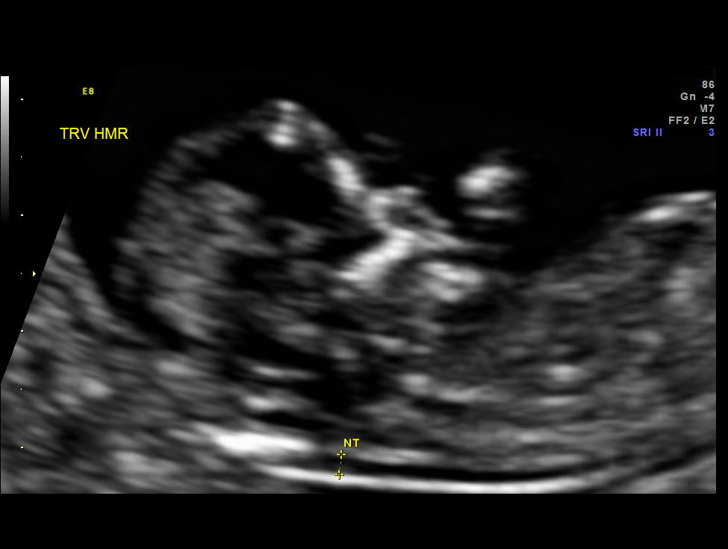
[im 12/26]
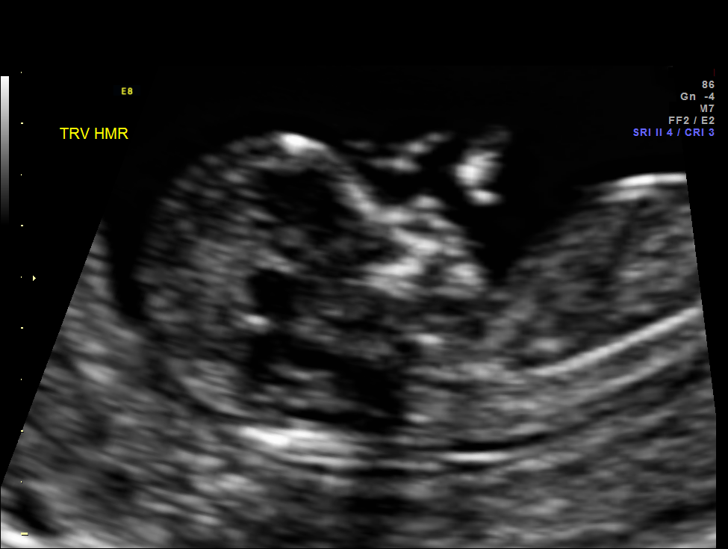
[im 14/26]
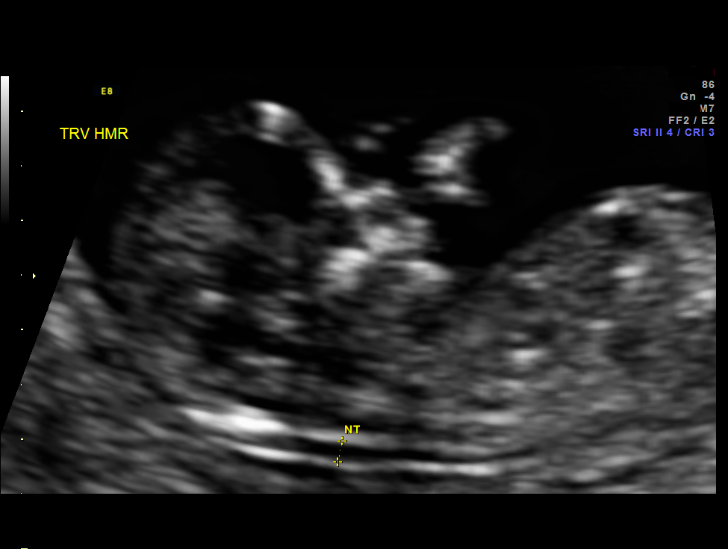
[im 16/26]
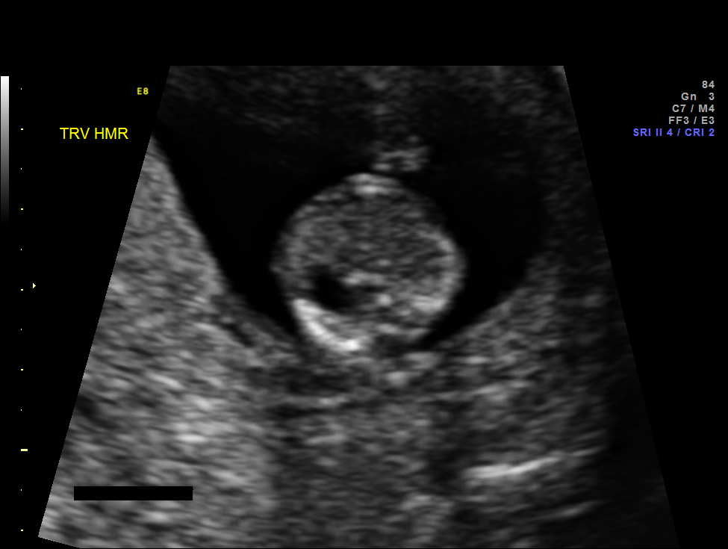
[im 18/26]
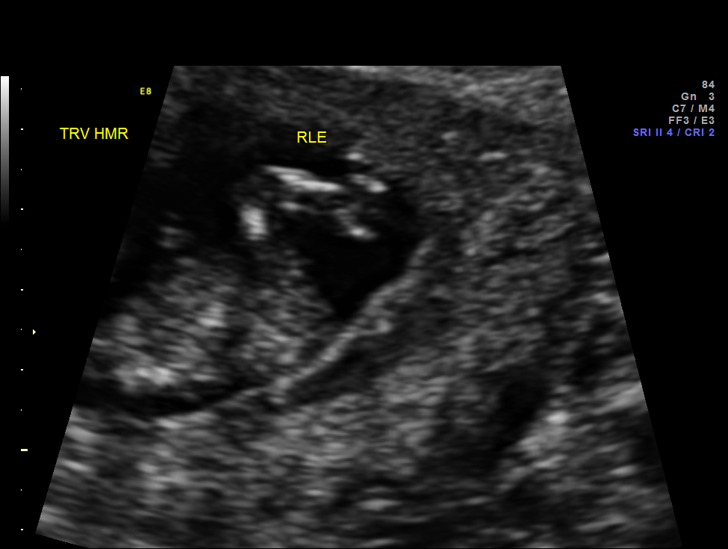
[im 20/26]
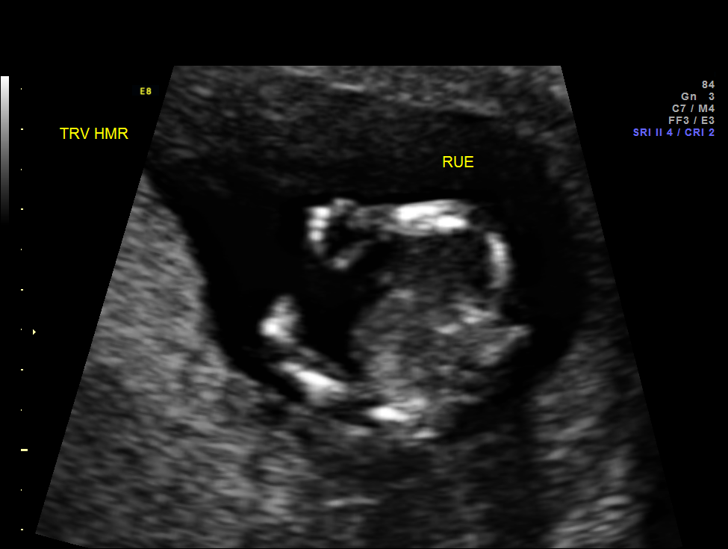
[im 22/26]
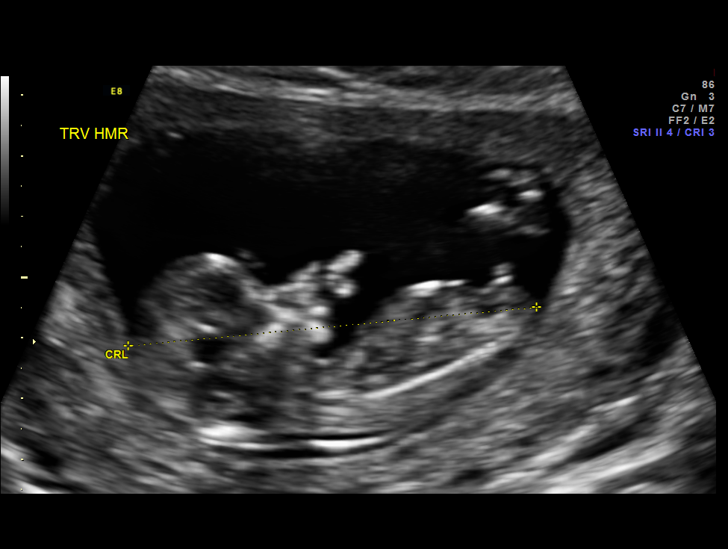
[im 24/26]
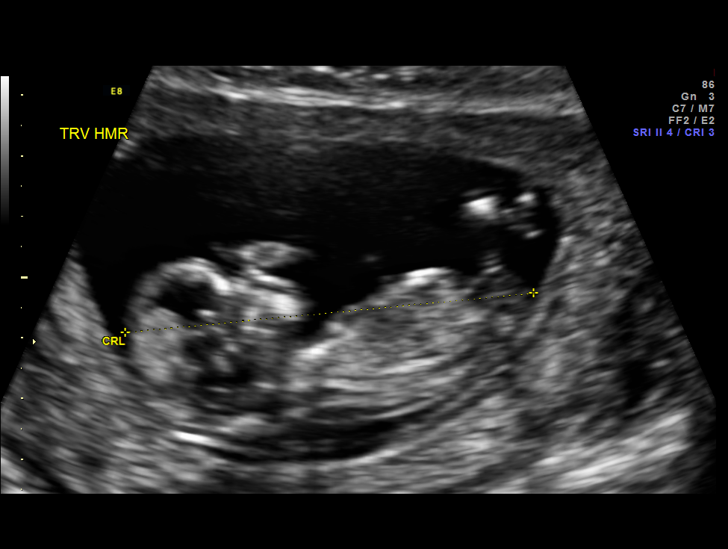
[im 26/26]
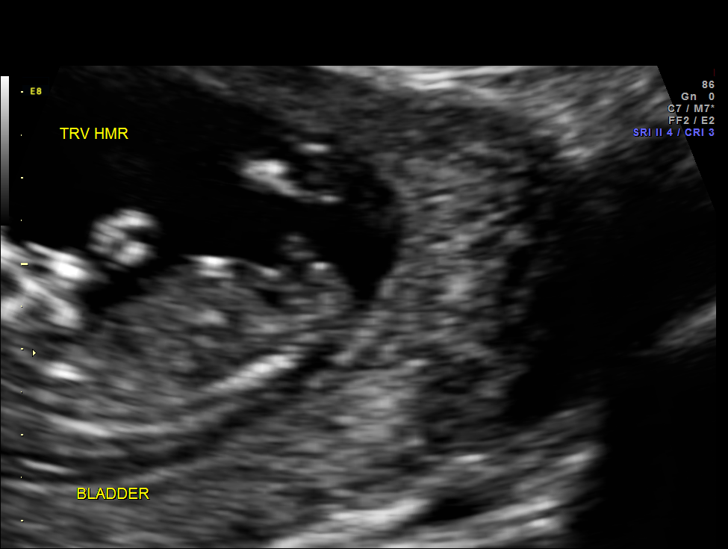

[13 of 26 positions shown; findings below may reference images not displayed]

OBSTETRICS REPORT
                      (Signed Final 03/23/2014 [DATE])

Service(s) Provided

Indications

 First trimester aneuploidy screen (NT)
Fetal Evaluation

 Num Of Fetuses:    1
 Fetal Heart Rate:  148                          bpm
 Cardiac Activity:  Observed
 Presentation:      Transverse, head to
                    maternal right
 Placenta:          Posterior

 Amniotic Fluid
 AFI FV:      Subjectively within normal limits
Gestational Age

 LMP:           15w 3d        Date:  12/05/13                 EDD:   09/11/14
 Best:          12w 6d     Det. By:  U/S C R L (03/08/14)     EDD:   09/29/14
1st Trimester Genetic Sonogram Screening

 CRL:            67.5  mm    G. Age:   12w 6d                 EDD:   09/29/14
 Nuc Trans:       2.1  mm

 Nasal Bone:                 Present
Anatomy

 Cranium:          Appears normal         Bladder:          Appears normal
 Choroid Plexus:   Appears normal         Lower             Visualized
                                          Extremities:
 Stomach:          Appears normal, left   Upper             Visualized
                   sided                  Extremities:
Cervix Uterus Adnexa

 Cervix:       Normal appearance by transabdominal scan.

 Adnexa:     No abnormality visualized.
Comments

 The patient presents for nuchal translucency screen as part
 of a first trimester screen.  Blood drawn for serum analytes
 today.
Impression

 Single living intrauterine pregnancy at 12 weeks 6 days.
 First trimester screen.
 No gross fetal anomalies identified.
Recommendations

 Recommend follow-up ultrasound examination in 6 weeks for
 detailed fetal anatomic survey.

 questions or concerns.
                Mendieta, Taisha

## 2015-12-22 IMAGING — US US OB COMP +14 WK
2 series · 12 of 28 positions shown · non-contrast
Comparison: none

[Series 1: us ob comp +14 wk mfm · 2 of 15 slices shown (1 of 2)]
[im 4/15]
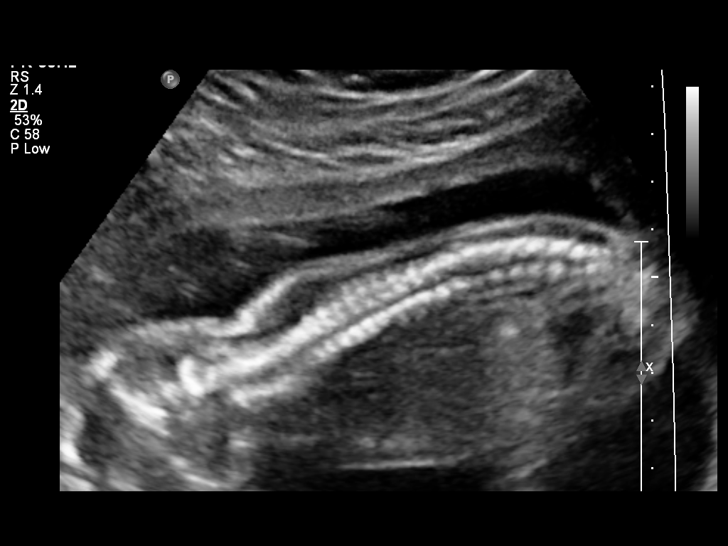
[im 11/15]
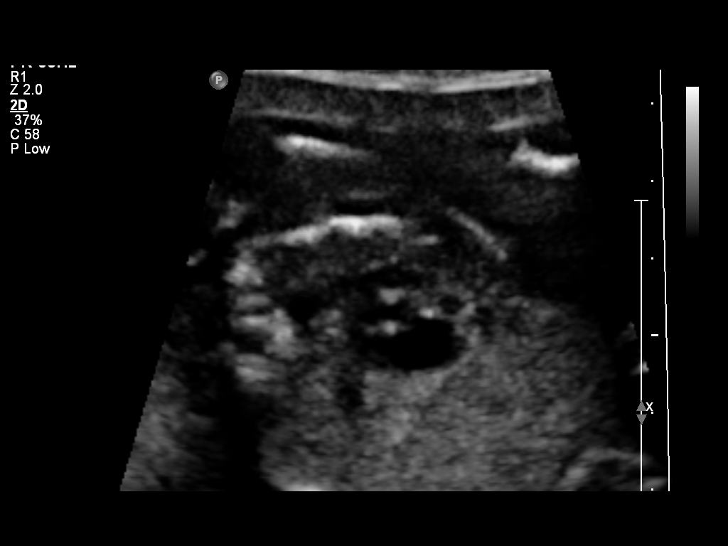

[Series 1: us ob comp +14 wk mfm · 10 of 70 slices shown (2 of 2)]
[im 1/70]
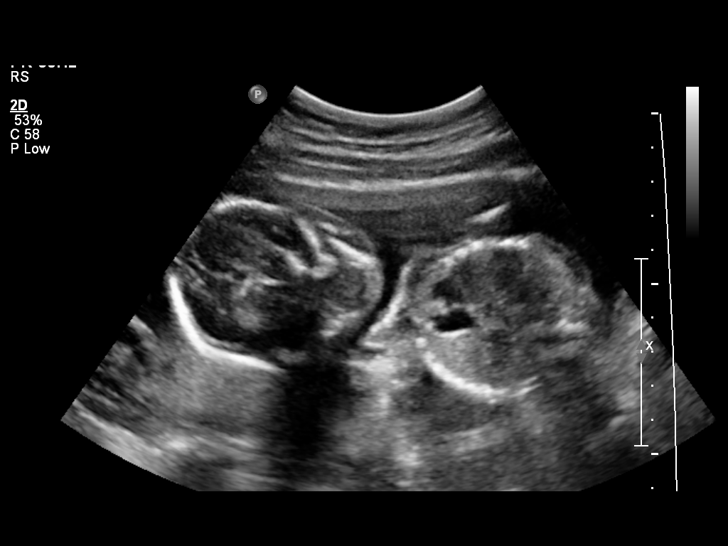
[im 10/70]
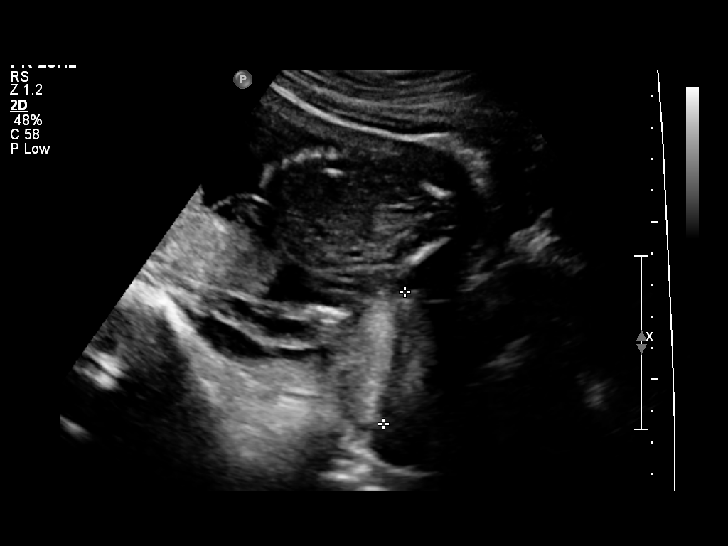
[im 16/70]
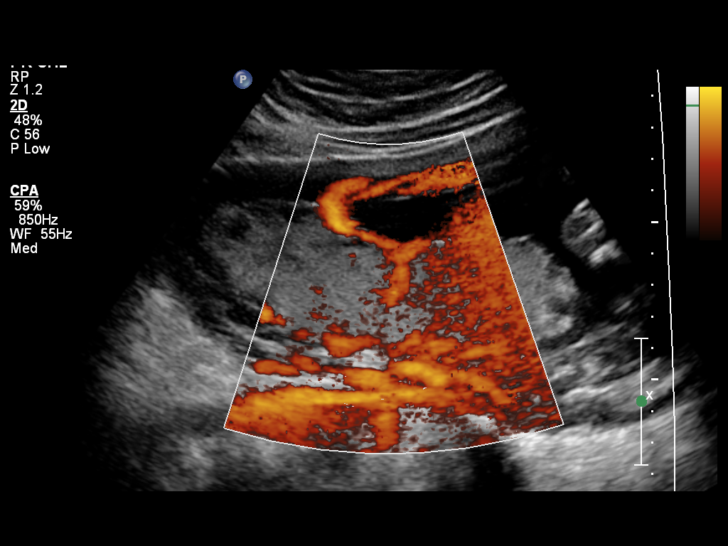
[im 22/70]
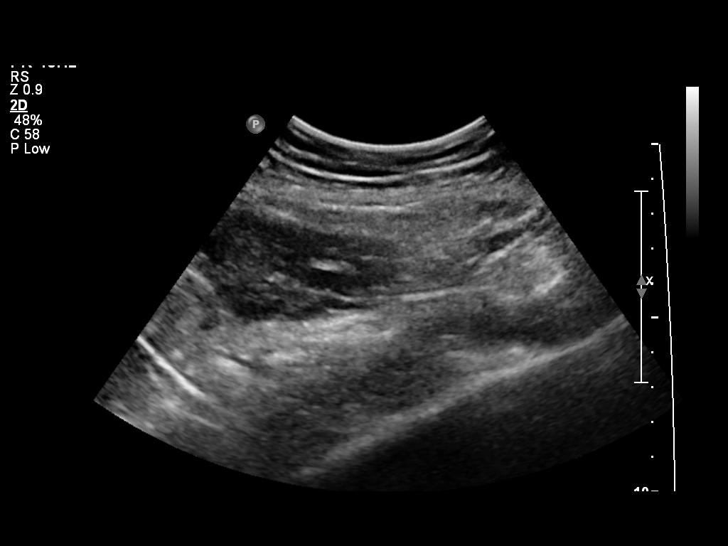
[im 32/70]
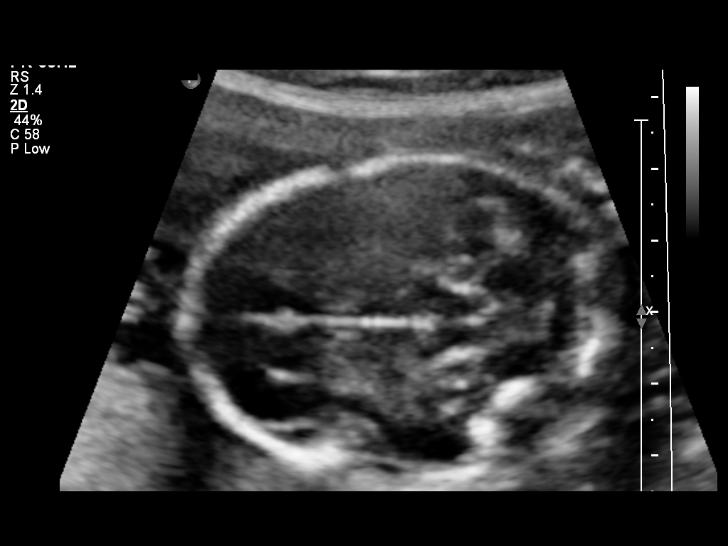
[im 38/70]
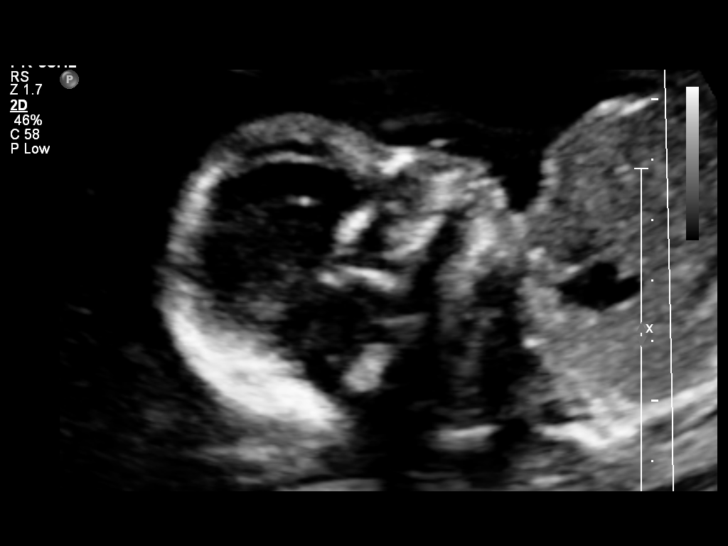
[im 44/70]
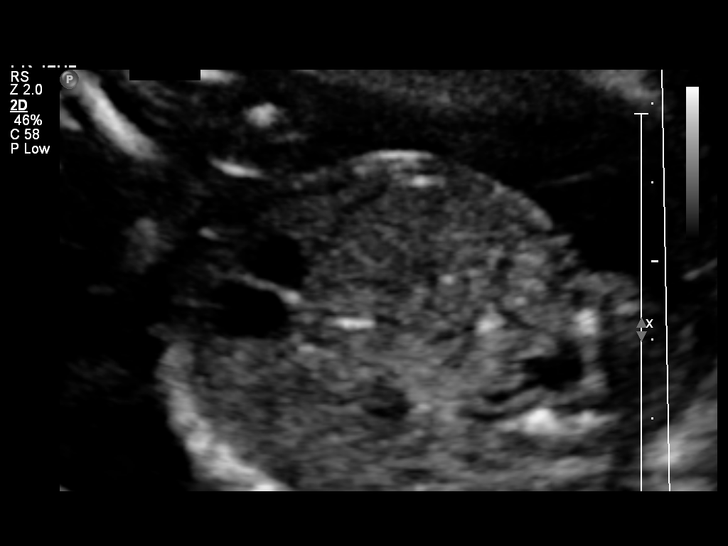
[im 54/70]
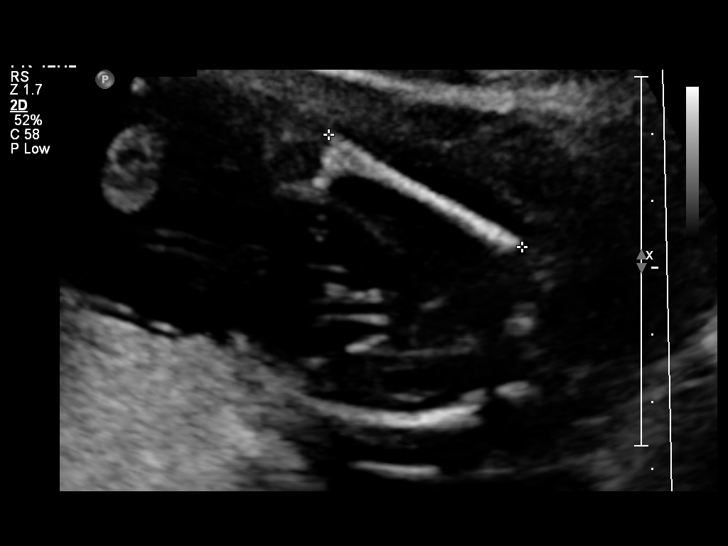
[im 60/70]
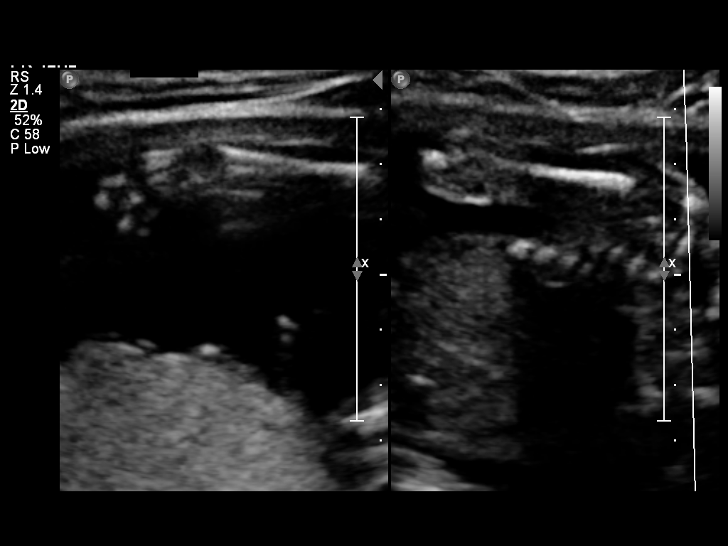
[im 66/70]
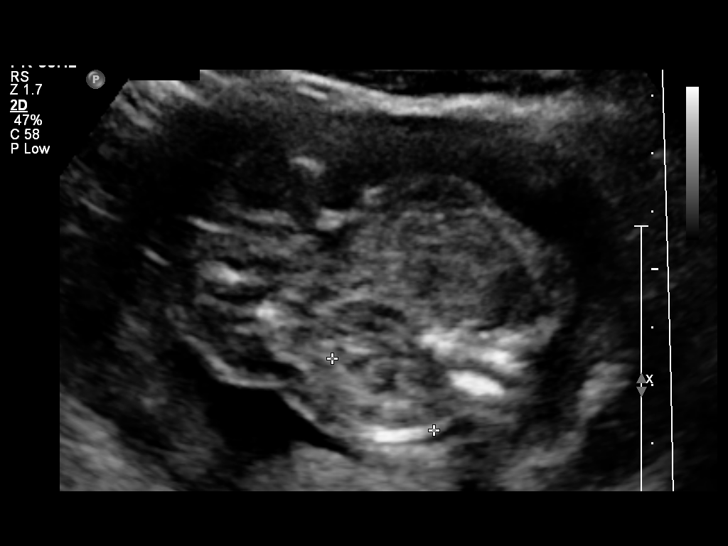

[12 of 28 positions shown; findings below may reference images not displayed]

OBSTETRICS REPORT
                      (Signed Final 05/10/2014 [DATE])

Service(s) Provided

 US OB COMP + 14 WK                                    76805.1
Indications

 Basic anatomic survey
Fetal Evaluation

 Num Of Fetuses:    1
 Fetal Heart Rate:  150                          bpm
 Cardiac Activity:  Observed
 Presentation:      Breech
 Placenta:          Anterior, above cervical os
 P. Cord            Visualized, central
 Insertion:

 Amniotic Fluid
 AFI FV:      Subjectively within normal limits
                                             Larg Pckt:     4.8  cm
Biometry

 BPD:     45.2  mm     G. Age:  19w 5d                CI:        69.21   70 - 86
                                                      FL/HC:      19.1   16.8 -

 HC:     173.5  mm     G. Age:  19w 6d       51  %    HC/AC:      1.09   1.09 -

 AC:     159.4  mm     G. Age:  21w 0d       84  %    FL/BPD:
 FL:      33.2  mm     G. Age:  20w 3d       67  %    FL/AC:      20.8   20 - 24
 CER:     19.4  mm     G. Age:  18w 5d       24  %
 NFT:     3.43  mm

 Est. FW:     366  gm    0 lb 13 oz      60  %
Gestational Age

 LMP:           22w 2d        Date:  12/05/13                 EDD:   09/11/14
 U/S Today:     20w 2d                                        EDD:   09/25/14
 Best:          19w 5d     Det. By:  U/S C R L (03/08/14)     EDD:   09/29/14
Anatomy

 Cranium:          Appears normal         Aortic Arch:      Not well visualized
 Fetal Cavum:      Appears normal         Ductal Arch:      Appears normal
 Ventricles:       Appears normal         Diaphragm:        Appears normal
 Choroid Plexus:   Appears normal         Stomach:          Appears normal, left
                                                            sided
 Cerebellum:       Appears normal         Abdomen:          Appears normal
 Posterior Fossa:  Appears normal         Abdominal Wall:   Appears nml (cord
                                                            insert, abd wall)
 Nuchal Fold:      Appears normal         Cord Vessels:     Appears normal (3
                                                            vessel cord)
 Face:             Appears normal         Kidneys:          Appear normal
                   (orbits and profile)
 Lips:             Appears normal         Bladder:          Appears normal
 Heart:            Appears normal         Spine:            Appears normal
                   (4CH, axis, and
                   situs)
 RVOT:             Not well visualized    Lower             Appears normal
                                          Extremities:
 LVOT:             Appears normal         Upper             Appears normal
                                          Extremities:

 Other:  Fetus appears to be a male. Heels visualized. LT 5th digit visualized.
         Technically difficult due to fetal position.
Targeted Anatomy

 Fetal Central Nervous System
 Cisterna Magna:
Cervix Uterus Adnexa

 Cervical Length:    4.2      cm

 Cervix:       Normal appearance by transabdominal scan.
 Left Ovary:    Not visualized. No adnexal mass visualized.
 Right Ovary:   Within normal limits.
 Adnexa:     No abnormality visualized.
Impression

 Single IUP at 19w 5d
 Normal fetal anatomic survey
 Somewhat limited views of the fetal heart obtained (RVOT,
 Aortic arch)
 Anterior placenta without previa
 Normal amniotic fluid volume
Recommendations

 Recommend follow-up ultrasound examination in 4-6 weeks
 to reevaluate the fetal heart

 questions or concerns.

## 2016-04-09 ENCOUNTER — Ambulatory Visit: Payer: Medicaid Other | Admitting: Certified Nurse Midwife

## 2016-04-24 ENCOUNTER — Ambulatory Visit: Payer: Medicaid Other | Admitting: Certified Nurse Midwife

## 2016-04-24 ENCOUNTER — Ambulatory Visit (INDEPENDENT_AMBULATORY_CARE_PROVIDER_SITE_OTHER): Payer: Medicaid Other | Admitting: Family

## 2016-04-24 ENCOUNTER — Encounter: Payer: Self-pay | Admitting: Family

## 2016-04-24 VITALS — BP 104/66 | HR 67 | Temp 97.8°F | Ht 65.0 in | Wt 141.0 lb

## 2016-04-24 DIAGNOSIS — Z3049 Encounter for surveillance of other contraceptives: Secondary | ICD-10-CM | POA: Diagnosis not present

## 2016-04-24 DIAGNOSIS — Z3040 Encounter for surveillance of contraceptives, unspecified: Secondary | ICD-10-CM

## 2016-04-24 DIAGNOSIS — N760 Acute vaginitis: Secondary | ICD-10-CM

## 2016-04-24 MED ORDER — FLUCONAZOLE 150 MG PO TABS: 150.0000 mg | ORAL_TABLET | Freq: Once | ORAL | 1 refills | Status: AC

## 2016-04-24 NOTE — Progress Notes (Signed)
Subjective:    Reubin Milanndrea Blankenhorn is a 25 y.o. female who presents for contraception counseling. The patient has no complaints today. The patient is sexually active. Pertinent past medical history: none.  Menstrual History: OB History    Gravida Para Term Preterm AB Living   2 1 1  0 1 1   SAB TAB Ectopic Multiple Live Births   0 1 0 0 1      Past Medical History:  Diagnosis Date  . BV (bacterial vaginosis)   . HSV-2 infection    Positive culture on 12/04/13; primary outbreak   Menarche age: [age] Patient's last menstrual period was 04/19/2016 (approximate).    The following portions of the patient's history were reviewed and updated as appropriate: allergies, current medications, past family history, past medical history, past social history, past surgical history and problem list.  Review of Systems Pertinent items are noted in HPI.   Objective:    BP 104/66   Pulse 67   Temp 97.8 F (36.6 C) (Oral)   Ht 5\' 5"  (1.651 m)   Wt 141 lb (64 kg)   LMP 04/19/2016 (Approximate)   Breastfeeding? No   BMI 23.46 kg/m  General appearance: alert, cooperative and appears stated age Head: Normocephalic, without obvious abnormality, atraumatic Lungs: clear to auscultation bilaterally Heart: regular rate and rhythm, S1, S2 normal, no murmur, click, rub or gallop   Assessment:    25 y.o., starting IUD,Plans to schedule no contraindications.   Plan:    Schedule IUD Insertion    Reviewed the various methods, side effects and benefits; answered all questions.  Eino FarberWalidah Kennith GainN Karim, CNM

## 2016-04-24 NOTE — Patient Instructions (Addendum)
Place depot medroxyprogesterone acetate injection patient instructions here.  Thank you for enrolling in MyChart. Please follow the instructions below to securely access your online medical record. MyChart allows you to send messages to your doctor, view your test results, manage appointments, and more.   How Do I Sign Up? 1. In your Internet browser, go to [employer] and enter [email or web address]. 2. Click on the Sign Up Now link in the Sign In box. You will see the New Member Sign Up page. 3. Enter your MyChart Access Code exactly as it appears below. You will not need to use this code after you've completed the sign-up process. If you do not sign up before the expiration date, you must request a new code.  MyChart Access Code: [email or web address] Expires: 06/23/2016  2:52 PM  4. Enter your Social Security Number ([id number]) and Date of Birth (mm/dd/yyyy) as indicated and click Submit. You will be taken to the next sign-up page. 5. Create a MyChart ID. This will be your MyChart login ID and cannot be changed, so think of one that is secure and easy to remember. 6. Create a MyChart password. You can change your password at any time. 7. Enter your Password Reset Question and Answer. This can be used at a later time if you forget your password.  8. Enter your e-mail address. You will receive e-mail notification when new information is available in MyChart. 9. Click Sign Up. You can now view your medical record.   Additional Information Remember, MyChart is NOT to be used for urgent needs. For medical emergencies, dial 911.   Intrauterine Device Information An intrauterine device (IUD) is inserted into your uterus to prevent pregnancy. There are two types of IUDs available:   Copper IUD--This type of IUD is wrapped in copper wire and is placed inside the uterus. Copper makes the uterus and fallopian tubes produce a fluid that kills sperm. The copper IUD can stay in place for  10 years.  Hormone IUD--This type of IUD contains the hormone progestin (synthetic progesterone). The hormone thickens the cervical mucus and prevents sperm from entering the uterus. It also thins the uterine lining to prevent implantation of a fertilized egg. The hormone can weaken or kill the sperm that get into the uterus. One type of hormone IUD can stay in place for 5 years, and another type can stay in place for 3 years. Your health care provider will make sure you are a good candidate for a contraceptive IUD. Discuss with your health care provider the possible side effects.  ADVANTAGES OF AN INTRAUTERINE DEVICE  IUDs are highly effective, reversible, long acting, and low maintenance.   There are no estrogen-related side effects.   An IUD can be used when breastfeeding.   IUDs are not associated with weight gain.   The copper IUD works immediately after insertion.   The hormone IUD works right away if inserted within 7 days of your period starting. You will need to use a backup method of birth control for 7 days if the hormone IUD is inserted at any other time in your cycle.  The copper IUD does not interfere with your female hormones.   The hormone IUD can make heavy menstrual periods lighter and decrease cramping.   The hormone IUD can be used for 3 or 5 years.   The copper IUD can be used for 10 years. DISADVANTAGES OF AN INTRAUTERINE DEVICE  The hormone IUD can be associated with irregular bleeding patterns.  The copper IUD can make your menstrual flow heavier and more painful.   You may experience cramping and vaginal bleeding after insertion.    This information is not intended to replace advice given to you by your health care provider. Make sure you discuss any questions you have with your health care provider.   Document Released: 07/15/2004 Document Revised: 04/13/2013 Document Reviewed: 01/30/2013 Elsevier Interactive Patient Education [employer].

## 2016-05-14 ENCOUNTER — Ambulatory Visit: Payer: Medicaid Other | Admitting: Certified Nurse Midwife

## 2016-10-30 ENCOUNTER — Encounter (HOSPITAL_COMMUNITY): Payer: Self-pay

## 2016-10-30 ENCOUNTER — Emergency Department (HOSPITAL_COMMUNITY)
Admission: EM | Admit: 2016-10-30 | Discharge: 2016-10-30 | Disposition: A | Discharge status: Home / Self Care | Payer: Medicaid Other | Attending: Emergency Medicine | Admitting: Emergency Medicine

## 2016-10-30 ENCOUNTER — Other Ambulatory Visit: Payer: Self-pay

## 2016-10-30 ENCOUNTER — Emergency Department (HOSPITAL_COMMUNITY): Payer: Medicaid Other

## 2016-10-30 DIAGNOSIS — R0789 Other chest pain: Secondary | ICD-10-CM | POA: Diagnosis not present

## 2016-10-30 DIAGNOSIS — R0602 Shortness of breath: Secondary | ICD-10-CM | POA: Diagnosis present

## 2016-10-30 DIAGNOSIS — R079 Chest pain, unspecified: Secondary | ICD-10-CM

## 2016-10-30 LAB — I-STAT TROPONIN, ED: Troponin i, poc: 0 ng/mL (ref 0.00–0.08)

## 2016-10-30 LAB — BASIC METABOLIC PANEL
Anion gap: 7 (ref 5–15)
BUN: 11 mg/dL (ref 6–20)
CALCIUM: 9.3 mg/dL (ref 8.9–10.3)
CHLORIDE: 106 mmol/L (ref 101–111)
CO2: 24 mmol/L (ref 22–32)
CREATININE: 0.62 mg/dL (ref 0.44–1.00)
GFR calc Af Amer: >60 mL/min (ref >60)
GFR calc non Af Amer: >60 mL/min (ref >60)
GLUCOSE: 92 mg/dL (ref 65–99)
Potassium: 3.6 mmol/L (ref 3.5–5.1)
Sodium: 137 mmol/L (ref 135–145)

## 2016-10-30 LAB — CBC
HCT: 40.1 % (ref 36.0–46.0)
Hemoglobin: 13.4 g/dL (ref 12.0–15.0)
MCH: 27.3 pg (ref 26.0–34.0)
MCHC: 33.4 g/dL (ref 30.0–36.0)
MCV: 81.7 fL (ref 78.0–100.0)
PLATELETS: 266 10*3/uL (ref 150–400)
RBC: 4.91 MIL/uL (ref 3.87–5.11)
RDW: 12.6 % (ref 11.5–15.5)
WBC: 7.6 10*3/uL (ref 4.0–10.5)

## 2016-10-30 NOTE — ED Triage Notes (Addendum)
Per Pt, PT is coming from home with complaints of intermittent SOB and chest pain x 2 weeks. Pt denies any cough or congestion. Reports, "If it is not my heart, it may be anxiety. Sometimes I feel like my heart is numb."

## 2016-10-30 NOTE — ED Provider Notes (Signed)
[facility]-EMERGENCY DEPT Provider Note   CSN: [id number] Arrival date & time: 10/30/16  1500     History   Chief Complaint Chief Complaint  Patient presents with  . Shortness of Breath    HPI Alyssa Mcbride is a 26 y.o. female. Patient is an otherwise healthy female who presents with 3 days of intermittent shortness of breath and chest pain.  Triage reports it was 2 weeks, however patient says it really just been the last 3 days that has been bothering her.  She states her symptoms come on spontaneously.  Describes it as feeling like her heart has a funny sensation and feels numb.  She feels like her heart rhythm is irregular at times.  When this happened, she feels like it's hard to catch her breath.  She says there is occasionally some discomfort which feels like pressure in her chest, but it is nonexertional and not associated with any nausea, vomiting, or diaphoresis.  She has not had any cough, congestion, fever, or chills.  She feels that this may be related to her anxiety.  She has had a couple other episodes similar to this in the past few years, but none the persist like this.  She is had no leg swelling or leg pain.  No history of DVT or PE.  She is not on any birth control.  HPI  Past Medical History:  Diagnosis Date  . BV (bacterial vaginosis)   . 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 Active labor at term 09/28/2014  . HSV-2 infection     Past Surgical History:  Procedure Laterality Date  . BUNIONECTOMY Bilateral     OB History    Gravida Para Term Preterm AB Living   2 1 1  0 1 1   SAB TAB Ectopic Multiple Live Births   0 1 0 0 1       Home Medications    Prior to Admission medications   Not on File    Family History Family History  Problem Relation Age of Onset  . Hypertension Mother   . Heart attack Neg Hx     Social History Social History  Substance Use Topics  .  Smoking status: Never Smoker  . Smokeless tobacco: Never Used  . Alcohol use Yes     Comment: socially     Allergies   Flagyl [metronidazole]   Review of Systems Review of Systems  Constitutional: Negative for chills and fever.  HENT: Negative for ear pain and sore throat.   Eyes: Negative for pain and visual disturbance.  Respiratory: Positive for shortness of breath. Negative for cough.   Cardiovascular: Positive for chest pain. Negative for palpitations and leg swelling.  Gastrointestinal: Negative for abdominal pain and vomiting.  Genitourinary: Negative for dysuria and hematuria.  Musculoskeletal: Negative for arthralgias, back pain, gait problem, joint swelling and myalgias.  Skin: Negative for color change and rash.  Neurological: Negative for seizures and syncope.  All other systems reviewed and are negative.    Physical Exam Updated Vital Signs BP 112/73 (BP Location: Left Arm)   Pulse 83   Temp 97.7 F (36.5 C) (Oral)   Resp 18   Ht 5\' 5"  (1.651 m)   Wt 63.5 kg   LMP 10/04/2016   SpO2 100%   BMI 23.30 kg/m   Physical Exam  Constitutional: She is oriented to person, place, and  time. She appears well-developed and well-nourished. No distress.  HENT:  Head: Normocephalic and atraumatic.  Eyes: Conjunctivae are normal.  Neck: Neck supple.  Cardiovascular: Normal rate, regular rhythm and intact distal pulses.   No murmur heard. Pulmonary/Chest: Effort normal and breath sounds normal. No respiratory distress. She has no wheezes. She has no rales. She exhibits no tenderness.  Abdominal: Soft. She exhibits no distension and no mass. There is no tenderness. There is no rebound and no guarding.  Musculoskeletal: She exhibits no edema, tenderness or deformity.  No calf swelling or tenderness  Neurological: She is alert and oriented to person, place, and time. No cranial nerve deficit.  Ambulates without difficulty.  Skin: Skin is warm and dry.  Psychiatric: She  has a normal mood and affect.  Nursing note and vitals reviewed.    ED Treatments / Results  Labs (all labs ordered are listed, but only abnormal results are displayed) Labs Reviewed  BASIC METABOLIC PANEL  CBC  I-STAT TROPOININ, ED    EKG  EKG Interpretation  Date/Time:  Thursday October 30 2016 15:39:01 EST Ventricular Rate:  72 PR Interval:  136 QRS Duration: 78 QT Interval:  386 QTC Calculation: 422 R Axis:   90 Text Interpretation:  Normal sinus rhythm with sinus arrhythmia Rightward axis Borderline ECG no ischemic changes. no old comparison Confirmed by Donnald GarrePfeiffer, MD, Lebron ConnersMarcy [phone]([id number]) on 10/30/2016 5:11:36 PM       Radiology Dg Chest 2 View  Result Date: 10/30/2016 CLINICAL DATA:  Reason for exam: pt c/o of CP (from pressure feeling then squeezing feeling left of midsternal region) with SOB for the past three days; denies fever; denies cough. Pt thinks it might be related to anxiety but is not sure. No oth.*comment was truncated* EXAM: CHEST  2 VIEW COMPARISON:  None. FINDINGS: Midline trachea.  Normal heart size and mediastinal contours. Sharp costophrenic angles.  No pneumothorax.  Clear lungs. IMPRESSION: No active cardiopulmonary disease. Electronically Signed   By: Jeronimo GreavesKyle  Talbot M.D.   On: 10/30/2016 16:27    Procedures Procedures (including critical care time)  Medications Ordered in ED Medications - No data to display   Initial Impression / Assessment and Plan / ED Course  I have reviewed the triage vital signs and the nursing notes.  Pertinent labs & imaging results that were available during my care of the patient were reviewed by me and considered in my medical decision making (see chart for details).    Patient is a 26 year old female with history as above who presents with 3 days of intermittent chest pain, shortness of breath, and feeling like she has a numb sensation in her chest.  See history of present illness for full details.  Her labs are unremarkable as  above, including a negative troponin.  EKG shows normal sinus rhythm with sinus arrhythmia.  No concerning ischemic changes.  Per criteria is negative.  She has stable vital signs are within normal limits.  Based on these findings, I feel that DVT or PE are highly unlikely.  Also feel that this does not represent ACS.  Her pain has been nonexertional.  She is pain-free at this time.  Given the intermittent nature of her symptoms, it's possible that this is related to her anxiety.  She is well-appearing here.  She has had no symptoms in the Ed.  I feel she is stable for discharge at this time.  The plan is for her to establish care with a PCP.  The patient says  she plans to do so within the next 2 weeks.  Return precautions were discussed in detail.  Patient discharged in stable condition.  Final Clinical Impressions(s) / ED Diagnoses   Final diagnoses:  SOB (shortness of breath)  Chest pain, unspecified type    New Prescriptions New Prescriptions   No medications on file     Lennette Bihari, MD 10/30/16 1719    Arby Barrette, MD 10/31/16 [phone]

## 2016-11-06 ENCOUNTER — Ambulatory Visit (HOSPITAL_COMMUNITY)
Admission: EM | Admit: 2016-11-06 | Discharge: 2016-11-06 | Disposition: A | Discharge status: Home / Self Care | Payer: Medicaid Other | Attending: Family Medicine | Admitting: Family Medicine

## 2016-11-06 ENCOUNTER — Encounter (HOSPITAL_COMMUNITY): Payer: Self-pay | Admitting: Emergency Medicine

## 2016-11-06 DIAGNOSIS — B373 Candidiasis of vulva and vagina: Secondary | ICD-10-CM

## 2016-11-06 DIAGNOSIS — H0012 Chalazion right lower eyelid: Secondary | ICD-10-CM

## 2016-11-06 DIAGNOSIS — B3731 Acute candidiasis of vulva and vagina: Secondary | ICD-10-CM

## 2016-11-06 HISTORY — DX: Anxiety disorder, unspecified: F41.9

## 2016-11-06 MED ORDER — ERYTHROMYCIN 5 MG/GM OP OINT
TOPICAL_OINTMENT | OPHTHALMIC | 0 refills | Status: DC
Start: 2016-11-06 — End: 2017-03-23

## 2016-11-06 MED ORDER — FLUCONAZOLE 150 MG PO TABS: 150.0000 mg | ORAL_TABLET | Freq: Every day | ORAL | 1 refills | Status: DC

## 2016-11-06 NOTE — ED Provider Notes (Signed)
CSN: [id number]     Arrival date & time 11/06/16  1529 History   None    Chief Complaint  Patient presents with  . Eye Problem   (Consider location/radiation/quality/duration/timing/severity/associated sxs/prior Treatment) Patient c/o right eye discomfort for one week in right eye. She reports some right eye drainage.  She also is having a yeast infection.   The history is provided by the patient.  Eye Problem  Location:  Right eye Quality:  Aching Severity:  Mild Onset quality:  Sudden Duration:  1 week Timing:  Constant Progression:  Unchanged Chronicity:  New Relieved by:  Nothing Worsened by:  Nothing Associated symptoms: itching     Past Medical History:  Diagnosis Date  . Anxiety   . BV (bacterial vaginosis)   . 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 BUNIONECTOMY Bilateral    Family History  Problem Relation Age of Onset  . Hypertension Mother   . Heart attack Neg Hx    Social History  Substance Use Topics  . Smoking status: Never Smoker  . Smokeless tobacco: Never Used  . Alcohol use Yes     Comment: socially   OB History    Gravida Para Term Preterm AB Living   2 1 1  0 1 1   SAB TAB Ectopic Multiple Live Births   0 1 0 0 1     Review of Systems  Constitutional: Negative.   HENT: Negative.   Eyes: Positive for itching.  Respiratory: Negative.   Cardiovascular: Negative.   Gastrointestinal: Negative.   Endocrine: Negative.   Genitourinary: Negative.   Musculoskeletal: Negative.   Allergic/Immunologic: Negative.   Neurological: Negative.   Hematological: Negative.   Psychiatric/Behavioral: Negative.     Allergies  Flagyl [metronidazole]  Home Medications   Prior to Admission medications   Medication Sig Start Date End Date Taking? Authorizing Provider  erythromycin ophthalmic ointment Place a 1/2 inch ribbon of ointment into the right lower eyelid bid  11/06/16   Deatra Canter, FNP  fluconazole (DIFLUCAN) 150 MG tablet Take 1 tablet (150 mg total) by mouth daily. 11/06/16   Deatra Canter, FNP   Meds Ordered and Administered this Visit  Medications - No data to display  BP 114/62 (BP Location: Right Arm)   Pulse 69   Temp 98.4 F (36.9 C) (Oral)   LMP 10/09/2016 (Approximate)   SpO2 100%  No data found.   Physical Exam  Constitutional: She appears well-developed and well-nourished.  HENT:  Head: Normocephalic and atraumatic.  Right Ear: External ear normal.  Left Ear: External ear normal.  Mouth/Throat: Oropharynx is clear and moist.  Eyes: EOM are normal. Pupils are equal, round, and reactive to light.  Right inner eye lid with Chalazion and conjunctiva injected.  Neck: Normal range of motion. Neck supple.  Cardiovascular: Normal rate, regular rhythm and normal heart sounds.   Pulmonary/Chest: Effort normal.  Abdominal: Soft. Bowel sounds are normal.  Nursing note and vitals reviewed.   Urgent Care Course     Procedures (including critical care time)  Labs Review Labs Reviewed - No data to display  Imaging Review No results found.   Visual Acuity Review  Right Eye Distance:   Left Eye Distance:   Bilateral Distance:    Right Eye Near:   Left Eye Near:    Bilateral Near:         MDM  1. Chalazion of right lower eyelid   2. Vaginal yeast infection    Erythromycin opthalmic ointment apply bid  #3.5grams  Diflucan 150mg  one po qd #2w/1 rf      Deatra CanterWilliam J Christophr Calix, FNP 11/06/16 [phone]

## 2016-11-06 NOTE — ED Triage Notes (Signed)
Pt is having right eye pain for one week. Pt reports dried discharge on her right eye in the morning.  She reports some itching as well.  She says it is a lot better than it was but she is still having some pain.

## 2017-03-23 ENCOUNTER — Other Ambulatory Visit (HOSPITAL_COMMUNITY)
Admission: RE | Admit: 2017-03-23 | Discharge: 2017-03-23 | Disposition: A | Discharge status: Home / Self Care | Payer: Medicaid Other | Source: Ambulatory Visit | Attending: Certified Nurse Midwife | Admitting: Certified Nurse Midwife

## 2017-03-23 ENCOUNTER — Encounter: Payer: Self-pay | Admitting: Certified Nurse Midwife

## 2017-03-23 ENCOUNTER — Ambulatory Visit (INDEPENDENT_AMBULATORY_CARE_PROVIDER_SITE_OTHER): Payer: Medicaid Other | Admitting: Certified Nurse Midwife

## 2017-03-23 VITALS — BP 112/71 | HR 79 | Ht 64.5 in | Wt 151.8 lb

## 2017-03-23 DIAGNOSIS — N898 Other specified noninflammatory disorders of vagina: Secondary | ICD-10-CM | POA: Insufficient documentation

## 2017-03-23 DIAGNOSIS — Z30011 Encounter for initial prescription of contraceptive pills: Secondary | ICD-10-CM | POA: Diagnosis not present

## 2017-03-23 MED ORDER — LEVONORGEST-ETH ESTRADIOL-IRON 0.1-20 MG-MCG(21) PO TABS: 1.0000 | ORAL_TABLET | Freq: Every day | ORAL | 12 refills | Status: AC

## 2017-03-23 NOTE — Progress Notes (Signed)
Patient ID: Alyssa Mcbride, female   DOB: 05-14-1991, 26 y.o.   MRN: [id number]  Chief Complaint  Patient presents with  . GYN    HPI Alyssa Mcbride is a 26 y.o. female.  Chronic BV/yeast infections and desire to change OCPs d/t weight gain and yeast infections.  Currently on Sprintec and has noticed decreased Libido and increased yeast infections.  Does consume a lot of sugar.  Encouraged to change diet and start on probiotics. Declines STD blood testing.  Is currently sexually active.  Denies any changes in her period or abnormal periods.    HPI  Past Medical History:  Diagnosis Date  . Anxiety   . BV (bacterial vaginosis)   . 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 BUNIONECTOMY Bilateral     Family History  Problem Relation Age of Onset  . Hypertension Mother   . Heart attack Neg Hx     Social History Social History  Substance Use Topics  . Smoking status: Never Smoker  . Smokeless tobacco: Never Used  . Alcohol use Yes     Comment: socially    Allergies  Allergen Reactions  . Flagyl [Metronidazole] Itching    Current Outpatient Prescriptions  Medication Sig Dispense Refill  . norgestimate-ethinyl estradiol (ORTHO-CYCLEN,SPRINTEC,PREVIFEM) 0.25-35 MG-MCG tablet Take 1 tablet by mouth daily.    Marland Kitchen. erythromycin ophthalmic ointment Place a 1/2 inch ribbon of ointment into the right lower eyelid bid (Patient not taking: Reported on 03/23/2017) 3.5 g 0  . fluconazole (DIFLUCAN) 150 MG tablet Take 1 tablet (150 mg total) by mouth daily. (Patient not taking: Reported on 03/23/2017) 2 tablet 1  . Levonorgest-Eth Estrad-Fe Bisg (BALCOLTRA) 0.1-20 MG-MCG(21) TABS Take 1 tablet by mouth daily. 28 tablet 12   No current facility-administered medications for this visit.     Review of Systems Review of Systems Constitutional: negative for fatigue and weight loss Respiratory: negative for cough and  wheezing Cardiovascular: negative for chest pain, fatigue and palpitations Gastrointestinal: negative for abdominal pain and change in bowel habits Genitourinary:+ change in vaginal discharge Integument/breast: negative for nipple discharge Musculoskeletal:negative for myalgias Neurological: negative for gait problems and tremors Behavioral/Psych: negative for abusive relationship, depression Endocrine: negative for temperature intolerance      Blood pressure 112/71, pulse 79, height 5' 4.5" (1.638 m), weight 151 lb 12.8 oz (68.9 kg), last menstrual period 02/27/2017.  Physical Exam Physical Exam General:   alert  Skin:   no rash or abnormalities  Lungs:   clear to auscultation bilaterally  Heart:   regular rate and rhythm, S1, S2 normal, no murmur, click, rub or gallop  Breasts:   deferred  Abdomen:  normal findings: no organomegaly, soft, non-tender and no hernia  Pelvis:  External genitalia: normal general appearance, swab obtained, rest of pelvic exam deferred     50% of 30 min visit spent on counseling and coordination of care.    Data Reviewed Previous medical hx, meds, labs  Assessment     Chronic BV Contraception management: [facility]    Plan   Hand written Rx for Boric acid vaginal supp. Given.  Meds ordered this encounter  Medications  . norgestimate-ethinyl estradiol (ORTHO-CYCLEN,SPRINTEC,PREVIFEM) 0.25-35 MG-MCG tablet    Sig: Take 1 tablet by mouth daily.  Clelia Schaumann. Levonorgest-Eth Estrad-Fe Bisg (BALCOLTRA) 0.1-20 MG-MCG(21) TABS    Sig: Take 1 tablet by mouth daily.    Dispense:  28  tablet    Refill:  12     Possible management options include:A1C Follow up as needed.

## 2017-03-23 NOTE — Progress Notes (Signed)
Patient is currently on BC pill and wants to discuss different options to switch to a different pill. Pt complains of weight gain and re-current yeast infections.

## 2017-03-24 LAB — CERVICOVAGINAL ANCILLARY ONLY
BACTERIAL VAGINITIS: NEGATIVE
CHLAMYDIA, DNA PROBE: NEGATIVE
Candida vaginitis: POSITIVE — AB
Neisseria Gonorrhea: NEGATIVE
TRICH (WINDOWPATH): NEGATIVE

## 2017-03-25 ENCOUNTER — Other Ambulatory Visit: Payer: Self-pay | Admitting: Certified Nurse Midwife

## 2017-03-25 DIAGNOSIS — B373 Candidiasis of vulva and vagina: Secondary | ICD-10-CM

## 2017-03-25 DIAGNOSIS — B3731 Acute candidiasis of vulva and vagina: Secondary | ICD-10-CM

## 2017-03-25 MED ORDER — TERCONAZOLE 0.8 % VA CREA: 1.0000 | TOPICAL_CREAM | Freq: Every day | VAGINAL | 0 refills | Status: DC

## 2017-03-25 MED ORDER — FLUCONAZOLE 200 MG PO TABS
200.0000 mg | ORAL_TABLET | Freq: Once | ORAL | 0 refills | Status: AC
Start: 2017-03-25 — End: 2017-03-25

## 2017-04-23 ENCOUNTER — Ambulatory Visit: Payer: Medicaid Other | Admitting: Certified Nurse Midwife

## 2017-07-28 ENCOUNTER — Ambulatory Visit: Payer: Self-pay | Admitting: Certified Nurse Midwife

## 2017-07-31 ENCOUNTER — Ambulatory Visit: Payer: Self-pay | Admitting: Certified Nurse Midwife

## 2017-07-31 ENCOUNTER — Ambulatory Visit (HOSPITAL_COMMUNITY)
Admission: EM | Admit: 2017-07-31 | Discharge: 2017-07-31 | Disposition: A | Discharge status: Home / Self Care | Payer: Medicaid Other | Attending: Emergency Medicine | Admitting: Emergency Medicine

## 2017-07-31 ENCOUNTER — Other Ambulatory Visit: Payer: Self-pay

## 2017-07-31 ENCOUNTER — Encounter (HOSPITAL_COMMUNITY): Payer: Self-pay | Admitting: Emergency Medicine

## 2017-07-31 DIAGNOSIS — B373 Candidiasis of vulva and vagina: Secondary | ICD-10-CM | POA: Diagnosis not present

## 2017-07-31 DIAGNOSIS — H109 Unspecified conjunctivitis: Secondary | ICD-10-CM | POA: Diagnosis not present

## 2017-07-31 DIAGNOSIS — B3731 Acute candidiasis of vulva and vagina: Secondary | ICD-10-CM

## 2017-07-31 MED ORDER — FLUCONAZOLE 200 MG PO TABS: ORAL_TABLET | ORAL | 0 refills | Status: DC

## 2017-07-31 MED ORDER — POLYMYXIN B-TRIMETHOPRIM 10000-0.1 UNIT/ML-% OP SOLN: 1.0000 [drp] | OPHTHALMIC | 0 refills | Status: AC

## 2017-07-31 NOTE — ED Triage Notes (Addendum)
Both eyes have been itchy for a few weeks.  Yesterday woke with right eye redness, pain, itching and drainage.  Patient does not wear contacts  Patient also has a vaginal discharge and is itchy.  Patient feels this is a yeast infection.  Denies any suspicion of an std

## 2017-07-31 NOTE — Discharge Instructions (Signed)
Make sure that you take your probiotic with a lactobacillus in it daily for the next 4 months )or eat a yogurt with a live active bacterial culture it daily).  In addition, you can use Zaditor eyedrops for the itching in the allergic eye.  "Refresh" eyedrops works well to soothe the discomfort.

## 2017-07-31 NOTE — ED Provider Notes (Signed)
[facility]    CSN: [id number] Arrival date & time: 07/31/17  1649     History   Chief Complaint Chief Complaint  Patient presents with  . Eye Problem  . Vaginitis    HPI Alyssa Mcbride is a 26 y.o. female.   Is presenting today with complaint of an itchy uncomfortable right eye for approximately 2 weeks.  Patient states she initially thought was an allergic response but she has been rubbing it and putting her fingers and is concerned that she may have gotten a pinkeye from her toddler daughter who is in daycare.  In addition the patient states that she has been having difficulty with persistent vaginal yeast infection for which she has been seen and treated by her OB/GYN for the past few months.  Patient states treatment was one over-the-counter Diflucan which would make it better and then it would subsequently return.  Denies significant medical history or recent antibiotic use.  No known drug allergies. HPI  Past Medical History:  Diagnosis Date  . Anxiety   . BV (bacterial vaginosis)   . 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 HSV-2 infection     Past Surgical History:  Procedure Laterality Date  . BUNIONECTOMY Bilateral     OB History    Gravida Para Term Preterm AB Living   2 1 1  0 1 1   SAB TAB Ectopic Multiple Live Births   0 1 0 0 1       Home Medications    Prior to Admission medications   Medication Sig Start Date End Date Taking? Authorizing Provider  fluconazole (DIFLUCAN) 200 MG tablet Take one 200mg  tablet on day one and the second 200mg  tablet on day 3. 07/31/17   Servando Salinaossi, Sparkles Mcneely H, NP  Levonorgest-Eth Estrad-Fe Bisg (BALCOLTRA) 0.1-20 MG-MCG(21) TABS Take 1 tablet by mouth daily. 03/23/17   Orvilla Cornwallenney, Rachelle A, CNM  terconazole (TERAZOL 3) 0.8 % vaginal cream Place 1 applicator vaginally at bedtime. 03/25/17   Orvilla Cornwallenney, Rachelle A, CNM  trimethoprim-polymyxin  b (POLYTRIM) ophthalmic solution Place 1 drop into both eyes every 4 (four) hours. 07/31/17   Servando Salinaossi, Meric Joye H, NP    Family History Family History  Problem Relation Age of Onset  . Hypertension Mother   . Heart attack Neg Hx     Social History Social History   Tobacco Use  . Smoking status: Never Smoker  . Smokeless tobacco: Never Used  Substance Use Topics  . Alcohol use: Yes    Comment: socially  . Drug use: Yes    Types: Marijuana    Comment: LAST USED-  03-30-2014     Allergies   Flagyl [metronidazole]   Review of Systems Review of Systems  Constitutional: Negative.  Negative for fatigue and fever.  HENT: Negative.   Eyes: Positive for discharge, redness and itching. Negative for visual disturbance.       Yellowish green discharge around the right eye yesterday morning and this morning and increasing severity.  Respiratory: Negative.  Negative for cough and shortness of breath.   Cardiovascular: Negative.  Negative for chest pain and leg swelling.  Gastrointestinal: Negative.  Negative for abdominal distention, abdominal pain, diarrhea, nausea and vomiting.  Endocrine: Negative.   Genitourinary: Positive for vaginal discharge. Negative for decreased urine volume, difficulty urinating, dyspareunia, dysuria, enuresis, flank pain, frequency, genital sores, hematuria, menstrual problem, pelvic pain, urgency,  vaginal bleeding and vaginal pain.       Patient reports some generalized white vaginal discharge with mild itching.  Musculoskeletal: Negative.  Negative for gait problem and neck stiffness.  Skin: Negative.  Negative for rash.  Allergic/Immunologic: Negative.   Neurological: Negative.  Negative for dizziness and headaches.  Hematological: Negative.   Psychiatric/Behavioral: Negative.      Physical Exam Triage Vital Signs ED Triage Vitals  Enc Vitals Group     BP 07/31/17 1708 114/78     Pulse Rate 07/31/17 1708 (!) 52     Resp 07/31/17 1708 18     Temp  07/31/17 1708 98.5 F (36.9 C)     Temp Source 07/31/17 1708 Oral     SpO2 07/31/17 1708 98 %     Weight --      Height --      Head Circumference --      Peak Flow --      Pain Score 07/31/17 1704 7     Pain Loc --      Pain Edu? --      Excl. in GC? --    No data found.  Updated Vital Signs BP 114/78 (BP Location: Right Arm)   Pulse (!) 52   Temp 98.5 F (36.9 C) (Oral)   Resp 18   LMP 07/10/2017   SpO2 98%   Visual Acuity Right Eye Distance: 20/25 Left Eye Distance: 20/25 Bilateral Distance: 20/20  Right Eye Near:   Left Eye Near:    Bilateral Near:     Physical Exam  Constitutional: She appears well-developed and well-nourished. No distress.  Eyes: EOM are normal. Pupils are equal, round, and reactive to light. Right eye exhibits no discharge. Left eye exhibits no discharge. No scleral icterus.  Right eye is injected in appearance.  Neck: Normal range of motion. Neck supple. No thyromegaly present.  Cardiovascular: Normal rate, regular rhythm, normal heart sounds and intact distal pulses. Exam reveals no gallop and no friction rub.  No murmur heard. Pulmonary/Chest: Effort normal and breath sounds normal. No respiratory distress. She has no wheezes. She has no rales. She exhibits no tenderness.  Abdominal: Soft. Bowel sounds are normal. She exhibits no distension and no mass. There is no tenderness. There is no rebound and no guarding.  Genitourinary:  Genitourinary Comments: Patient reports that she is not currently sexually active and is currently being treated for a persistent vaginal yeast infection.  Patient denies recent history of bacterial vaginosis and reports being familiar with BV symptoms during her pregnancy.  Reports recent STD testing with her OB/GYN was negative.  Lymphadenopathy:    She has no cervical adenopathy.  Skin: Skin is warm and dry. Capillary refill takes less than 2 seconds. No rash noted. She is not diaphoretic.  Nursing note and vitals  reviewed.    UC Treatments / Results  Labs (all labs ordered are listed, but only abnormal results are displayed) Labs Reviewed - No data to display  EKG  EKG Interpretation None       Radiology No results found.  Procedures Procedures (including critical care time)  Medications Ordered in UC Medications - No data to display   Initial Impression / Assessment and Plan / UC Course  I have reviewed the triage vital signs and the nursing notes.  Pertinent labs & imaging results that were available during my care of the patient were reviewed by me and considered in my medical decision making (see chart for  details).    She is alert oriented and cooperative.  Patient seems well versed in the signs and symptoms of vaginal yeast infection and is currently under care and treatment of her OB/GYN.  Patient states she is seeking treatment here tonight as her OB/GYN canceled her appointment today and rescheduled her for this Tuesday.  Discussed the use of pulse therapy with that with the Diflucan with concurrent daily use of a probiotic or yogurt with live lactobacillus as alternative treatment.  In addition emphasized the importance of acidic pH for vaginal health and not to use excessive excessive water or soaps when cleansing.  Final Clinical Impressions(s) / UC Diagnoses   Final diagnoses:  Bacterial conjunctivitis of right eye  Vaginal yeast infection    ED Discharge Orders        Ordered    trimethoprim-polymyxin b (POLYTRIM) ophthalmic solution  Every 4 hours     07/31/17 1729    fluconazole (DIFLUCAN) 200 MG tablet     07/31/17 1729       Controlled Substance Prescriptions [place] Controlled Substance Registry consulted? No   The usual and customary discharge instructions and warnings were given.  The patient verbalizes understanding and agrees to plan of care.       Servando Salinaossi, Mykel Sponaugle H, NP 07/31/17 1740

## 2017-08-04 ENCOUNTER — Other Ambulatory Visit: Payer: Medicaid Other

## 2017-08-07 ENCOUNTER — Other Ambulatory Visit: Payer: Medicaid Other

## 2017-08-07 DIAGNOSIS — B379 Candidiasis, unspecified: Secondary | ICD-10-CM

## 2017-08-08 LAB — HEMOGLOBIN A1C
Est. average glucose Bld gHb Est-mCnc: 108 mg/dL
HEMOGLOBIN A1C: 5.4 % (ref 4.8–5.6)

## 2017-08-13 ENCOUNTER — Ambulatory Visit (HOSPITAL_COMMUNITY)
Admission: EM | Admit: 2017-08-13 | Discharge: 2017-08-13 | Disposition: A | Discharge status: Home / Self Care | Payer: Medicaid Other | Attending: Family Medicine | Admitting: Family Medicine

## 2017-08-13 ENCOUNTER — Encounter (HOSPITAL_COMMUNITY): Payer: Self-pay | Admitting: Emergency Medicine

## 2017-08-13 DIAGNOSIS — L03012 Cellulitis of left finger: Secondary | ICD-10-CM

## 2017-08-13 DIAGNOSIS — H109 Unspecified conjunctivitis: Secondary | ICD-10-CM | POA: Diagnosis not present

## 2017-08-13 MED ORDER — CEPHALEXIN 500 MG PO CAPS: 500.0000 mg | ORAL_CAPSULE | Freq: Four times a day (QID) | ORAL | 0 refills | Status: AC

## 2017-08-13 MED ORDER — CIPROFLOXACIN HCL 0.3 % OP OINT: TOPICAL_OINTMENT | Freq: Three times a day (TID) | OPHTHALMIC | 0 refills | Status: AC

## 2017-08-13 NOTE — ED Provider Notes (Signed)
[facility]    CSN: [id number] Arrival date & time: 08/13/17  1523     History   Chief Complaint Chief Complaint  Patient presents with  . Conjunctivitis    HPI Alyssa Mcbride is a 26 y.o. female presenting with persistent red eye and discharge. Had allergies and a lot of eye itching prior to onset of pink eye. She was seen here on 12/07 and treated with polytrim for pink eye. States symptoms worsened briefly and then have been improving but persistent. She has noticed increase in blurry vision, she feels the discharge is causing this. No loss of vision, no pain. Mild irritation and discomfort. Wear glasses but does not use contacts.   Also concerned about swelling around her finger near her nail bed.   HPI  Past Medical History:  Diagnosis Date  . Anxiety   . BV (bacterial vaginosis)   . 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 HSV-2 infection     Past Surgical History:  Procedure Laterality Date  . BUNIONECTOMY Bilateral     OB History    Gravida Para Term Preterm AB Living   2 1 1  0 1 1   SAB TAB Ectopic Multiple Live Births   0 1 0 0 1       Home Medications    Prior to Admission medications   Medication Sig Start Date End Date Taking? Authorizing Provider  trimethoprim-polymyxin b (POLYTRIM) ophthalmic solution Place 1 drop into both eyes every 4 (four) hours. 07/31/17  Yes Servando Salinaossi, Catherine H, NP  fluconazole (DIFLUCAN) 200 MG tablet Take one 200mg  tablet on day one and the second 200mg  tablet on day 3. 07/31/17   Servando Salinaossi, Catherine H, NP  Levonorgest-Eth Estrad-Fe Bisg (BALCOLTRA) 0.1-20 MG-MCG(21) TABS Take 1 tablet by mouth daily. 03/23/17   Orvilla Cornwallenney, Rachelle A, CNM  terconazole (TERAZOL 3) 0.8 % vaginal cream Place 1 applicator vaginally at bedtime. 03/25/17   Roe Coombsenney, Rachelle A, CNM    Family History Family History  Problem Relation Age of Onset  . Hypertension  Mother   . Heart attack Neg Hx     Social History Social History   Tobacco Use  . Smoking status: Never Smoker  . Smokeless tobacco: Never Used  Substance Use Topics  . Alcohol use: Yes    Comment: socially  . Drug use: Yes    Types: Marijuana    Comment: LAST USED-  03-30-2014     Allergies   Flagyl [metronidazole]   Review of Systems Review of Systems  Constitutional: Negative for fever.  HENT: Negative for ear pain.   Eyes: Positive for discharge, itching and visual disturbance. Negative for photophobia, pain and redness.  Respiratory: Negative for shortness of breath.   Cardiovascular: Negative for chest pain.  Gastrointestinal: Negative for abdominal pain, nausea and vomiting.  Neurological: Negative for dizziness, light-headedness and headaches.     Physical Exam Triage Vital Signs ED Triage Vitals  Enc Vitals Group     BP 08/13/17 1544 108/63     Pulse Rate 08/13/17 1544 80     Resp 08/13/17 1544 18     Temp 08/13/17 1544 98.6 F (37 C)     Temp Source 08/13/17 1544 Oral     SpO2 08/13/17 1544 98 %     Weight --      Height --      Head Circumference --  Peak Flow --      Pain Score 08/13/17 1542 7     Pain Loc --      Pain Edu? --      Excl. in GC? --    No data found.  Updated Vital Signs BP 108/63 (BP Location: Left Arm)   Pulse 80   Temp 98.6 F (37 C) (Oral)   Resp 18   LMP 08/13/2017   SpO2 98%   Visual Acuity Right Eye Distance:   20/25  Left Eye Distance:   20/20 Bilateral Distance:   20/20   Physical Exam  Constitutional: She appears well-developed and well-nourished. No distress.  HENT:  Head: Normocephalic and atraumatic.  Right Ear: External ear normal.  Left Ear: External ear normal.  Nose: Nose normal.  Eyes: EOM and lids are normal. Pupils are equal, round, and reactive to light. Lids are everted and swept, no foreign bodies found. Right eye exhibits discharge. Left eye exhibits no discharge. Right conjunctiva is  injected. Right conjunctiva has no hemorrhage. Left conjunctiva is not injected. Left conjunctiva has no hemorrhage.  Visual acuity relatively equal Right eye: erythematous to inner aspect of eye, greenish yellow discharge in inner canthus.   Cardiovascular: Normal rate and regular rhythm.  Pulmonary/Chest: Effort normal and breath sounds normal. No respiratory distress. She has no wheezes.  Skin:  Mild swelling and erythema surrounding middle finger of left hand. No tenderness to palpation of pulp of finger.   Nursing note and vitals reviewed.   UC Treatments / Results  Labs (all labs ordered are listed, but only abnormal results are displayed) Labs Reviewed - No data to display  EKG  EKG Interpretation None       Radiology No results found.  Procedures Procedures (including critical care time)  Medications Ordered in UC Medications - No data to display   Initial Impression / Assessment and Plan / UC Course  I have reviewed the triage vital signs and the nursing notes.  Pertinent labs & imaging results that were available during my care of the patient were reviewed by me and considered in my medical decision making (see chart for details).     Will try Cipro ointment TID x 7 days. Follow up with opthalmologist if symptoms not improving.   Patient with persistent conjunctivitis. . Patient lacked significant pain, photophobia, loss of vision. Lack of pain and lack of vision loss make less Acute angle glaucoma, Iritis/Uveitis less concerning.  Unlikely retinal detachment given visual acuity, unlikely acute angle glaucoma with lack of pain.   Given keflex for paronychia and possible infection surrounding orbits. Warm soaks.   Discussed return precautions. Patient verbalized understanding and is agreeable with plan.   Final Clinical Impressions(s) / UC Diagnoses   Final diagnoses:  None    ED Discharge Orders    None       Controlled Substance  Prescriptions [place] Controlled Substance Registry consulted? Not Applicable   Lew DawesWieters, Hallie C, [place] JerseyPA-C 08/13/17 1645

## 2017-08-13 NOTE — Discharge Instructions (Signed)
Pink eye/Conjunctivitis: Please use cipro ointment in lower led of eye 3 times a day. Please follow up with your ophthalmologist in 1 week if not improving.   Please return immediately if experiencing any loss of vision, eye pain, increased swelling around eye.  For finger: Use keflex 4 times a day for 5 days, use warm soaks 3 tiems a day.

## 2017-08-13 NOTE — ED Triage Notes (Signed)
PT C/O: pt here for persistent pink eye on right eye... Reports blurred vision  ONSET: 2 weeks .... Was seen here on 07/31/17  TAKING MEDS: none   Also c/o swelling around right middle finger nailbed x2 days  A&O x4... NAD... Ambulatory

## 2017-08-20 ENCOUNTER — Emergency Department (HOSPITAL_COMMUNITY)
Admission: EM | Admit: 2017-08-20 | Discharge: 2017-08-20 | Disposition: A | Discharge status: Home / Self Care | Payer: Medicaid Other | Attending: Emergency Medicine | Admitting: Emergency Medicine

## 2017-08-20 ENCOUNTER — Other Ambulatory Visit: Payer: Self-pay

## 2017-08-20 DIAGNOSIS — M79646 Pain in unspecified finger(s): Secondary | ICD-10-CM | POA: Diagnosis not present

## 2017-08-20 DIAGNOSIS — Z5321 Procedure and treatment not carried out due to patient leaving prior to being seen by health care provider: Secondary | ICD-10-CM | POA: Insufficient documentation

## 2017-08-21 ENCOUNTER — Encounter (HOSPITAL_COMMUNITY): Payer: Self-pay | Admitting: Emergency Medicine

## 2017-08-21 ENCOUNTER — Other Ambulatory Visit: Payer: Self-pay

## 2017-08-21 ENCOUNTER — Emergency Department (HOSPITAL_COMMUNITY)
Admission: EM | Admit: 2017-08-21 | Discharge: 2017-08-21 | Disposition: A | Discharge status: Home / Self Care | Payer: Medicaid Other | Attending: Emergency Medicine | Admitting: Emergency Medicine

## 2017-08-21 DIAGNOSIS — Z79899 Other long term (current) drug therapy: Secondary | ICD-10-CM | POA: Insufficient documentation

## 2017-08-21 DIAGNOSIS — M79644 Pain in right finger(s): Secondary | ICD-10-CM | POA: Diagnosis present

## 2017-08-21 DIAGNOSIS — L03011 Cellulitis of right finger: Secondary | ICD-10-CM | POA: Diagnosis not present

## 2017-08-21 DIAGNOSIS — H109 Unspecified conjunctivitis: Secondary | ICD-10-CM

## 2017-08-21 DIAGNOSIS — H1031 Unspecified acute conjunctivitis, right eye: Secondary | ICD-10-CM | POA: Insufficient documentation

## 2017-08-21 MED ORDER — TETRACAINE HCL 0.5 % OP SOLN
2.0000 [drp] | Freq: Once | OPHTHALMIC | Status: AC
  Administered 2017-08-21: 2 [drp] via OPHTHALMIC
  Filled 2017-08-21: qty 4

## 2017-08-21 MED ORDER — LIDOCAINE HCL (PF) 1 % IJ SOLN
5.0000 mL | Freq: Once | INTRAMUSCULAR | Status: AC
Start: 2017-08-21 — End: 2017-08-21
  Administered 2017-08-21: 5 mL
  Filled 2017-08-21: qty 5

## 2017-08-21 MED ORDER — SULFAMETHOXAZOLE-TRIMETHOPRIM 800-160 MG PO TABS: 1.0000 | ORAL_TABLET | Freq: Two times a day (BID) | ORAL | 0 refills | Status: AC

## 2017-08-21 MED ORDER — FLUORESCEIN SODIUM 1 MG OP STRP
1.0000 | ORAL_STRIP | Freq: Once | OPHTHALMIC | Status: AC
  Administered 2017-08-21: 1 via OPHTHALMIC
  Filled 2017-08-21: qty 1

## 2017-08-21 MED ORDER — IBUPROFEN 800 MG PO TABS
800.0000 mg | ORAL_TABLET | Freq: Once | ORAL | Status: AC
  Administered 2017-08-21: 800 mg via ORAL
  Filled 2017-08-21: qty 1

## 2017-08-21 NOTE — Discharge Instructions (Addendum)
You were seen in the emergency department and diagnosed with a paronychia of your right third finger and conjunctivitis of your right eye.  A paronychia is an infection of the tissue around your nail bed. This was incised and drained today. I have given you a prescription for Bactrim, this is an antibiotic to take this once in the morning and once at night.  Stop taking the Keflex that you were prescribed.  Please see attached aftercare for incision and drainage.  We will up with your primary care provider in the next 1 week for reevaluation, I have given you alternative primary care contact information in your discharge instructions.   You will need to continued using the ciprofloxacin ophthalmic ointment that was prescribed to you by urgent care.  It is important that you take this as prescribed.  I have also given you information for an ophthalmologist please call make an appointment within the next 1 week.  Please take all of your antibiotics until finished. You may develop abdominal discomfort or diarrhea from the antibiotic.  You may help offset this with probiotics which you can buy at the store (ask your pharmacist if unable to find) or get probiotics in the form of eating yogurt. Do not eat or take the probiotics until 2 hours after your antibiotic. If you are unable to tolerate these side effects follow-up with your primary care provider or return to the emergency department.   If you begin to experience any blistering, rashes, swelling, or difficulty breathing seek medical care for evaluation of potentially more serious side effects.   Please be aware that this medication may interact with other medications you are taking, please be sure to discuss your medication list with your pharmacist. If you are taking birth control the antibiotic will deactivate your birth control for 2 weeks.     Return to the emergency department for any new or worsening symptoms including but not limited to fever,  chills, excess drainage from your finger, worsening of pain redness and swelling of your finger, eye pain, change in your vision, difficulty with eye movements, or any other concerns.

## 2017-08-21 NOTE — ED Provider Notes (Signed)
Incision and Drainage Procedure Note  Pre-operative Diagnosis: Paronychia of right middle finger  Post-operative Diagnosis: same  Indications: Paronychia of right middle finger  Anesthesia: 2% plain lidocaine  Procedure Details  The procedure, risks and complications have been discussed in detail (including, but not limited to airway compromise, infection, bleeding) with the patient, and the patient has signed consent to the procedure.  The skin was sterilely prepped and draped over the affected area in the usual fashion. After adequate local anesthesia, I&D with a #11 blade was performed on the right middle finger laterally. Purulent drainage: present.  The incision site was dressed with sterile gauze.  The patient was observed until stable.  Return precautions discussed in detail.  Findings: 0.5 cc purulent drainage  EBL: 3 cc's  Drains: 0.5 cc purulent drainage  Condition: Tolerated procedure well   Complications: none.     Almon HerculesGonfa, Rainier Feuerborn T, MD 08/21/17 1215    Margarita Grizzleay, Danielle, MD 08/26/17 [phone]

## 2017-08-21 NOTE — ED Provider Notes (Signed)
[facility] Provider [facility]   CSN: [id number] Arrival date & time: 08/21/17  [id number]     History   Chief Complaint Chief Complaint  Patient presents with  . Finger Injury    HPI Alyssa Mcbride is a 26 y.o. female with complaints of right middle finger pain and swelling that has been progressively worsening x 1 week and R eye itching and redness that has been present and somewhat improving x 1 month.  The patient was seen at an urgent care for both of these complaints 08/13/17.  She was given Keflex for treatment of paronychia as well as ciprofloxacin ophthalmic ointment for treatment of conjunctivitis (she had previously been using Polytrim drops prescribed to her 12/07).  Patient states that she has not been using either of these medications as prescribed.  States that she took the full dose of Keflex 1 day and then stopped taking it consistently.  She did not fill the prescription for the ciprofloxacin ophthalmic ointment until last night, states she applied it once and is concerned that she has difficulty seeing with the ointment in her eye, otherwise no change in vision.  Does not wear contact lenses. Discomfort in her finger is a 8 out of 10 in severity, worse with movement and palpation.  No significant alleviating factors.  Denies fever, chills, change in vision, nausea, vomiting, numbness, or weakness. Last tetanus 3 years prior.  HPI  Past Medical History:  Diagnosis Date  . Anxiety   . BV (bacterial vaginosis)   . 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 HSV-2 infection     Past Surgical History:  Procedure Laterality Date  . BUNIONECTOMY Bilateral     OB History    Gravida Para Term Preterm AB Living   2 1 1  0 1 1   SAB TAB Ectopic Multiple Live Births   0 1 0 0 1       Home Medications    Prior to Admission medications   Medication Sig Start  Date End Date Taking? Authorizing Provider  fluconazole (DIFLUCAN) 200 MG tablet Take one 200mg  tablet on day one and the second 200mg  tablet on day 3. 07/31/17   Servando Salinaossi, Catherine H, NP  Levonorgest-Eth Estrad-Fe Bisg (BALCOLTRA) 0.1-20 MG-MCG(21) TABS Take 1 tablet by mouth daily. 03/23/17   Orvilla Cornwallenney, Rachelle A, CNM  terconazole (TERAZOL 3) 0.8 % vaginal cream Place 1 applicator vaginally at bedtime. 03/25/17   Orvilla Cornwallenney, Rachelle A, CNM  trimethoprim-polymyxin b (POLYTRIM) ophthalmic solution Place 1 drop into both eyes every 4 (four) hours. 07/31/17   Servando Salinaossi, Catherine H, NP    Family History Family History  Problem Relation Age of Onset  . Hypertension Mother   . Heart attack Neg Hx     Social History Social History   Tobacco Use  . Smoking status: Never Smoker  . Smokeless tobacco: Never Used  Substance Use Topics  . Alcohol use: Yes    Comment: socially  . Drug use: Yes    Types: Marijuana    Comment: LAST USED-  03-30-2014     Allergies   Flagyl [metronidazole]   Review of Systems Review of Systems  Constitutional: Negative for chills and fever.  HENT: Negative for congestion, ear pain, rhinorrhea and sore throat.   Eyes: Positive for discharge (R), redness (R) and itching (R). Negative for pain and visual disturbance.  Respiratory:  Negative for shortness of breath.   Cardiovascular: Negative for chest pain.  Musculoskeletal:       Positive for pain/redness/swelling to tip of R middle finger  Neurological: Negative for weakness and numbness.  All other systems reviewed and are negative.   Physical Exam Updated Vital Signs BP 110/67 (BP Location: Right Arm)   Temp 98 F (36.7 C) (Oral)   Resp 20   LMP 08/13/2017   SpO2 100%   Physical Exam  Constitutional: She appears well-developed and well-nourished. No distress.  HENT:  Head: Normocephalic and atraumatic.  Right Ear: Tympanic membrane is not perforated, not erythematous, not retracted and not bulging.  Left  Ear: Tympanic membrane is not perforated, not erythematous, not retracted and not bulging.  Nose: Nose normal.  Mouth/Throat: Uvula is midline and oropharynx is clear and moist. No oropharyngeal exudate, posterior oropharyngeal erythema or tonsillar abscesses.  Eyes: EOM and lids are normal. Pupils are equal, round, and reactive to light. Right eye exhibits no discharge. Left eye exhibits no discharge. Right conjunctiva is injected (very mild).  No proptosis or periorbital swelling.  Woods lamp exam of R eye: No fluorescein uptake, no corneal abrasion or ulcer, no hyphema, or hypopyon. No dendritic uptake. No foreign body.   Neck: Normal range of motion. Neck supple.  Cardiovascular: Normal rate and regular rhythm.  No murmur heard. Pulses:      Radial pulses are 2+ on the right side, and 2+ on the left side.  Pulmonary/Chest: Breath sounds normal. No respiratory distress. She has no wheezes. She has no rales.  Abdominal: Soft. She exhibits no distension. There is no tenderness.  Lymphadenopathy:    She has no cervical adenopathy.  Neurological: She is alert.  5/5 grip strength bilaterally. Sensation intact to sharp and dull touch to bilateral upper extremities.   Skin: Skin is warm and dry. Capillary refill takes less than 2 seconds.  R hand: 3rd digit with erythema and palpable fluctuance just lateral to nail bed on medial aspect of finger consistent with paronychia. No extension into digital pulp space. Patient has full ROM of the digit with good capillary refill and sensation.   Psychiatric: She has a normal mood and affect. Her behavior is normal.  Nursing note and vitals reviewed.   ED Treatments / Results  Labs (all labs ordered are listed, but only abnormal results are displayed) Labs Reviewed - No data to display  EKG  EKG Interpretation None      Radiology No results found.  Procedures Procedures (including critical care time)  Medications Ordered in ED Medications    lidocaine (PF) (XYLOCAINE) 1 % injection 5 mL (not administered)  fluorescein ophthalmic strip 1 strip (not administered)  tetracaine (PONTOCAINE) 0.5 % ophthalmic solution 2 drop (not administered)    Initial Impression / Assessment and Plan / ED Course  I have reviewed the triage vital signs and the nursing notes.  Pertinent labs & imaging results that were available during my care of the patient were reviewed by me and considered in my medical decision making (see chart for details).  Patient presents with 2 complaints that are unrelated today including R 3rd finger sxs and exam consistent with paronychia and R eye sxs and exam consistent with improving conjunctivitis.    Paronychia of 3rd R finger, erythema/fluctuance/swelling do not extend into pulp space, doubt felon. I&D procedure performed by resident Dr. Alanda Slim, please see additional procedure note for details. Will stop patient's keflex and start Bactrim given purulent drainage  with I&D. I&D aftercare instructions provided to patient. Recommended PCP follow up for re-evaluation in 1 week.   Patient with very mild signs of R eye conjunctivitis, initially being treated as bacterial with Polytrim, recently switched to ciprofloxacin- just started this last night. Mild conjunctival injection, there is no  purulent drainage on exam.  No entrapment, periorbital swelling, or proptosis. No consensual photophobia.  No fluorescein uptake on woods lamp exam- no corneal abrasion or ulcer, no dendritic staining. Presentation non-concerning for iritis, corneal abrasions/ulcers, or HSV.  No evidence of preseptal or orbital cellulitis.  Pt is  not a contact lens wearer. Discussed the need to continue prescribed ciprofloxacin ointment and follow up with ophthalmology especially given length of symptoms.   I discussed treatment plan, need for PCP and ophthalmology follow-up, and return precautions with the patient. Provided opportunity for questions, patient  confirmed understanding and is in agreement with plan.    Final Clinical Impressions(s) / ED Diagnoses   Final diagnoses:  Paronychia of right middle finger  Conjunctivitis of right eye, unspecified conjunctivitis type    ED Discharge Orders        Ordered    sulfamethoxazole-trimethoprim (BACTRIM DS,SEPTRA DS) 800-160 MG tablet  2 times daily     08/21/17 [id number]       Kayly Kriegel, KootenaiSamantha R, PA-C 08/21/17 1741    Margarita Grizzleay, Danielle, MD 08/22/17 [id number]

## 2017-08-21 NOTE — ED Triage Notes (Signed)
Pt c/o blurred vision due to discharge in r eye. Been going on for month but feels improved some. R middle finger swelling, painful, red around nail bed. Took oral antibiotics from urgent care. No improvement. Did not take as instructed.

## 2017-09-02 ENCOUNTER — Ambulatory Visit: Payer: Medicaid Other | Admitting: Certified Nurse Midwife

## 2017-09-02 ENCOUNTER — Other Ambulatory Visit (HOSPITAL_COMMUNITY)
Admission: RE | Admit: 2017-09-02 | Discharge: 2017-09-02 | Disposition: A | Discharge status: Home / Self Care | Payer: Medicaid Other | Source: Ambulatory Visit | Attending: Certified Nurse Midwife | Admitting: Certified Nurse Midwife

## 2017-09-02 VITALS — BP 111/75 | HR 69 | Wt 154.1 lb

## 2017-09-02 DIAGNOSIS — N760 Acute vaginitis: Secondary | ICD-10-CM

## 2017-09-02 DIAGNOSIS — Z7251 High risk heterosexual behavior: Secondary | ICD-10-CM

## 2017-09-02 DIAGNOSIS — Z883 Allergy status to other anti-infective agents status: Secondary | ICD-10-CM | POA: Diagnosis not present

## 2017-09-02 DIAGNOSIS — Z8249 Family history of ischemic heart disease and other diseases of the circulatory system: Secondary | ICD-10-CM | POA: Insufficient documentation

## 2017-09-02 MED ORDER — TERCONAZOLE 0.8 % VA CREA: 1.0000 | TOPICAL_CREAM | Freq: Every day | VAGINAL | 0 refills | Status: AC

## 2017-09-02 MED ORDER — FLUCONAZOLE 200 MG PO TABS: 200.0000 mg | ORAL_TABLET | Freq: Once | ORAL | 0 refills | Status: AC

## 2017-09-02 MED ORDER — PRENATE PIXIE 10-0.6-0.4-200 MG PO CAPS: 1.0000 | ORAL_CAPSULE | Freq: Every day | ORAL | 12 refills | Status: AC

## 2017-09-03 ENCOUNTER — Telehealth: Payer: Self-pay

## 2017-09-03 LAB — CERVICOVAGINAL ANCILLARY ONLY
BACTERIAL VAGINITIS: NEGATIVE
Candida vaginitis: POSITIVE — AB
Trichomonas: NEGATIVE

## 2017-09-03 NOTE — Telephone Encounter (Signed)
Returned call and answered pt's questions about rx sent.

## 2017-09-07 ENCOUNTER — Encounter: Payer: Self-pay | Admitting: Certified Nurse Midwife

## 2017-09-07 NOTE — Progress Notes (Signed)
Patient ID: Alyssa Mcbride, female   DOB: 05-25-1991, 27 y.o.   MRN: [id number]  Chief Complaint  Patient presents with  . Vaginal Discharge    white no odor  . Vaginal Itching    x3d    HPI Alyssa Mcbride is a 27 y.o. female.  Here for change in vaginal discharge.  Reports itching and discharge X  1 week.  Does have a hx of HSV and BV.    HPI  Past Medical History:  Diagnosis Date  . Anxiety   . BV (bacterial vaginosis)   . 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 BUNIONECTOMY Bilateral     Family History  Problem Relation Age of Onset  . Hypertension Mother   . Heart attack Neg Hx     Social History Social History   Tobacco Use  . Smoking status: Never Smoker  . Smokeless tobacco: Never Used  Substance Use Topics  . Alcohol use: Yes    Comment: socially  . Drug use: Yes    Types: Marijuana    Comment: LAST USED-  03-30-2014    Allergies  Allergen Reactions  . Flagyl [Metronidazole] Itching    Current Outpatient Medications  Medication Sig Dispense Refill  . lisdexamfetamine (VYVANSE) 20 MG capsule Take 20 mg by mouth daily.    Clelia Schaumann Estrad-Fe Bisg (BALCOLTRA) 0.1-20 MG-MCG(21) TABS Take 1 tablet by mouth daily. 28 tablet 12  . Prenat-FeAsp-Meth-FA-DHA w/o A (PRENATE PIXIE) 10-0.6-0.4-200 MG CAPS Take 1 tablet by mouth daily. 30 capsule 12  . terconazole (TERAZOL 3) 0.8 % vaginal cream Place 1 applicator vaginally at bedtime. 20 g 0  . trimethoprim-polymyxin b (POLYTRIM) ophthalmic solution Place 1 drop into both eyes every 4 (four) hours. 10 mL 0   No current facility-administered medications for this visit.     Review of Systems Review of Systems Constitutional: negative for fatigue and weight loss Respiratory: negative for cough and wheezing Cardiovascular: negative for chest pain, fatigue and palpitations Gastrointestinal: negative for abdominal pain and  change in bowel habits Genitourinary:negative Integument/breast: negative for nipple discharge Musculoskeletal:negative for myalgias Neurological: negative for gait problems and tremors Behavioral/Psych: negative for abusive relationship, depression Endocrine: negative for temperature intolerance      Blood pressure 111/75, pulse 69, weight 154 lb 1.6 oz (69.9 kg), last menstrual period 08/13/2017.  Physical Exam Physical Exam General:   alert  Skin:   no rash or abnormalities  Lungs:   clear to auscultation bilaterally  Heart:   regular rate and rhythm, S1, S2 normal, no murmur, click, rub or gallop  Breasts:   normal without suspicious masses, skin or nipple changes or axillary nodes  Abdomen:  normal findings: no organomegaly, soft, non-tender and no hernia  Pelvis:  External genitalia: normal general appearance Urinary system: urethral meatus normal and bladder without fullness, nontender Vaginal: normal without tenderness, induration or masses     50% of 15 min visit spent on counseling and coordination of care.   Data Reviewed Previous medical hx, meds, labs  Assessment     Yeast vaginitis  1. Acute vaginitis    - Cervicovaginal ancillary only - terconazole (TERAZOL 3) 0.8 % vaginal cream; Place 1 applicator vaginally at bedtime.  Dispense: 20 g; Refill: 0 - fluconazole (DIFLUCAN) 200 MG tablet; Take 1 tablet (200 mg total) by mouth once for 1 dose. Repeat dose in 48-72 hours.  Dispense: 3 tablet;  Refill: 0  2. High risk heterosexual behavior     - Prenat-FeAsp-Meth-FA-DHA w/o A (PRENATE PIXIE) 10-0.6-0.4-200 MG CAPS; Take 1 tablet by mouth daily.  Dispense: 30 capsule; Refill: 12     Plan     Meds ordered this encounter  Medications  . terconazole (TERAZOL 3) 0.8 % vaginal cream    Sig: Place 1 applicator vaginally at bedtime.    Dispense:  20 g    Refill:  0  . fluconazole (DIFLUCAN) 200 MG tablet    Sig: Take 1 tablet (200 mg total) by mouth once for 1  dose. Repeat dose in 48-72 hours.    Dispense:  3 tablet    Refill:  0  . Prenat-FeAsp-Meth-FA-DHA w/o A (PRENATE PIXIE) 10-0.6-0.4-200 MG CAPS    Sig: Take 1 tablet by mouth daily.    Dispense:  30 capsule    Refill:  12    Possible management options include: LARK Follow up as needed.

## 2017-09-16 ENCOUNTER — Ambulatory Visit: Payer: Medicaid Other | Admitting: Certified Nurse Midwife

## 2017-10-02 ENCOUNTER — Encounter (HOSPITAL_COMMUNITY): Payer: Self-pay | Admitting: Emergency Medicine

## 2017-10-02 DIAGNOSIS — Z79899 Other long term (current) drug therapy: Secondary | ICD-10-CM | POA: Diagnosis not present

## 2017-10-02 DIAGNOSIS — R070 Pain in throat: Secondary | ICD-10-CM | POA: Diagnosis present

## 2017-10-02 DIAGNOSIS — J029 Acute pharyngitis, unspecified: Secondary | ICD-10-CM | POA: Diagnosis not present

## 2017-10-02 NOTE — ED Triage Notes (Signed)
Pt reports sore throat X few days, fever, chills, body aches.

## 2017-10-03 ENCOUNTER — Emergency Department (HOSPITAL_COMMUNITY)
Admission: EM | Admit: 2017-10-03 | Discharge: 2017-10-03 | Disposition: A | Discharge status: Home / Self Care | Payer: Medicaid Other | Attending: Emergency Medicine | Admitting: Emergency Medicine

## 2017-10-03 DIAGNOSIS — J029 Acute pharyngitis, unspecified: Secondary | ICD-10-CM

## 2017-10-03 LAB — RAPID STREP SCREEN (MED CTR MEBANE ONLY): Streptococcus, Group A Screen (Direct): NEGATIVE

## 2017-10-03 NOTE — ED Provider Notes (Signed)
[facility] EMERGENCY DEPARTMENT Provider Note   CSN: [id number] Arrival date & time: 10/02/17  2345     History   Chief Complaint Chief Complaint  Patient presents with  . Sore Throat    HPI Alyssa Mcbride is a 27 y.o. female.  Patient presents to the emergency department with chief complaint of sore throat.  She states that the symptoms started a couple of days ago.  She reports being concerned about strep throat.  She denies any fever, chills, cough.  She has not taken anything for symptoms.  It is worsened with swallowing.  She denies any difficulty breathing or swallowing.   The history is provided by the patient. No language interpreter was used.    Past Medical History:  Diagnosis Date  . Anxiety   . BV (bacterial vaginosis)   . 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 HSV-2 infection     Past Surgical History:  Procedure Laterality Date  . BUNIONECTOMY Bilateral     OB History    Gravida Para Term Preterm AB Living   2 1 1  0 1 1   SAB TAB Ectopic Multiple Live Births   0 1 0 0 1       Home Medications    Prior to Admission medications   Medication Sig Start Date End Date Taking? Authorizing Provider  Levonorgest-Eth Estrad-Fe Bisg (BALCOLTRA) 0.1-20 MG-MCG(21) TABS Take 1 tablet by mouth daily. 03/23/17   Orvilla Cornwall A, CNM  lisdexamfetamine (VYVANSE) 20 MG capsule Take 20 mg by mouth daily.    [provider]  Prenat-FeAsp-Meth-FA-DHA w/o A (PRENATE PIXIE) 10-0.6-0.4-200 MG CAPS Take 1 tablet by mouth daily. 09/02/17   Orvilla Cornwall A, CNM  terconazole (TERAZOL 3) 0.8 % vaginal cream Place 1 applicator vaginally at bedtime. 09/02/17   Orvilla Cornwall A, CNM  trimethoprim-polymyxin b (POLYTRIM) ophthalmic solution Place 1 drop into both eyes every 4 (four) hours. 07/31/17   Servando Salina, NP    Family History Family History  Problem  Relation Age of Onset  . Hypertension Mother   . Heart attack Neg Hx     Social History Social History   Tobacco Use  . Smoking status: Never Smoker  . Smokeless tobacco: Never Used  Substance Use Topics  . Alcohol use: Yes    Comment: socially  . Drug use: Yes    Types: Marijuana    Comment: LAST USED-  03-30-2014     Allergies   Flagyl [metronidazole]   Review of Systems Review of Systems  All other systems reviewed and are negative.    Physical Exam Updated Vital Signs BP 120/74 (BP Location: Right Arm)   Pulse 86   Temp 98.2 F (36.8 C) (Oral)   Resp 16   Ht 5\' 5"  (1.651 m)   Wt 68 kg (150 lb)   SpO2 99%   BMI 24.96 kg/m   Physical Exam  Constitutional: She is oriented to person, place, and time. She appears well-developed and well-nourished.  HENT:  Head: Normocephalic and atraumatic.  Oropharynx is mildly erythematous, no tonsillar exudate or abscess, uvula is midline, airway intact  Eyes: Conjunctivae and EOM are normal. Pupils are equal, round, and reactive to light.  Neck: Normal range of motion. Neck supple.  Cardiovascular: Normal rate and regular rhythm. Exam reveals no gallop and no friction rub.  No murmur heard. Pulmonary/Chest:  Effort normal and breath sounds normal. No respiratory distress. She has no wheezes. She has no rales. She exhibits no tenderness.  Abdominal: Soft. Bowel sounds are normal. She exhibits no distension and no mass. There is no tenderness. There is no rebound and no guarding.  Musculoskeletal: Normal range of motion. She exhibits no edema or tenderness.  Neurological: She is alert and oriented to person, place, and time.  Skin: Skin is warm and dry.  Psychiatric: She has a normal mood and affect. Her behavior is normal. Judgment and thought content normal.  Nursing note and vitals reviewed.    ED Treatments / Results  Labs (all labs ordered are listed, but only abnormal results are displayed) Labs Reviewed  RAPID  STREP SCREEN (NOT [facility])  CULTURE, GROUP A STREP [facility])    EKG  EKG Interpretation None       Radiology No results found.  Procedures Procedures (including critical care time)  Medications Ordered in ED Medications - No data to display   Initial Impression / Assessment and Plan / ED Course  I have reviewed the triage vital signs and the nursing notes.  Pertinent labs & imaging results that were available during my care of the patient were reviewed by me and considered in my medical decision making (see chart for details).     Patient with pharyngitis.  Rapid strep test negative.  No evidence of peritonsillar or tonsillar abscess, doubt retropharyngeal abscess, no stridor, vital signs are stable, patient is well-appearing.  Final Clinical Impressions(s) / ED Diagnoses   Final diagnoses:  Pharyngitis, unspecified etiology    ED Discharge Orders    None       Roxy HorsemanBrowning, Tyger Wichman, PA-C 10/03/17 0518    Gilda CreasePollina, Christopher J, MD 10/03/17 [phone]

## 2017-10-05 LAB — CULTURE, GROUP A STREP (THRC)

## 2017-10-19 ENCOUNTER — Other Ambulatory Visit: Payer: Self-pay | Admitting: Certified Nurse Midwife

## 2017-10-19 DIAGNOSIS — B3731 Acute candidiasis of vulva and vagina: Secondary | ICD-10-CM

## 2017-10-19 DIAGNOSIS — B373 Candidiasis of vulva and vagina: Secondary | ICD-10-CM

## 2017-10-19 MED ORDER — FLUCONAZOLE 200 MG PO TABS: 200.0000 mg | ORAL_TABLET | Freq: Once | ORAL | 0 refills | Status: AC

## 2017-10-27 ENCOUNTER — Telehealth: Payer: Self-pay

## 2017-10-27 ENCOUNTER — Other Ambulatory Visit: Payer: Self-pay | Admitting: Certified Nurse Midwife

## 2017-10-27 DIAGNOSIS — B3731 Acute candidiasis of vulva and vagina: Secondary | ICD-10-CM

## 2017-10-27 DIAGNOSIS — B373 Candidiasis of vulva and vagina: Secondary | ICD-10-CM

## 2017-10-27 MED ORDER — FLUCONAZOLE 200 MG PO TABS: 200.0000 mg | ORAL_TABLET | Freq: Once | ORAL | 0 refills | Status: AC

## 2017-10-27 NOTE — Telephone Encounter (Addendum)
TC from pt states she just ended a period and has developed another yeast infection. She's requesting a Diflucan rx. She has vaginal discomfort and itching. Pt is taking probiotics. Informed pt I will consult with provider and c/b.

## 2017-10-28 ENCOUNTER — Ambulatory Visit: Payer: Medicaid Other | Admitting: Certified Nurse Midwife

## 2017-10-28 NOTE — Telephone Encounter (Signed)
Pt notified rx is at [facility]

## 2017-10-29 ENCOUNTER — Ambulatory Visit: Payer: Medicaid Other | Admitting: Certified Nurse Midwife

## 2017-11-11 ENCOUNTER — Ambulatory Visit: Payer: Medicaid Other | Admitting: Certified Nurse Midwife

## 2017-11-11 DIAGNOSIS — Z539 Procedure and treatment not carried out, unspecified reason: Secondary | ICD-10-CM

## 2017-11-11 NOTE — Progress Notes (Signed)
Not seen by provider; on menses will reschedule.  No exam.

## 2017-11-19 ENCOUNTER — Ambulatory Visit (INDEPENDENT_AMBULATORY_CARE_PROVIDER_SITE_OTHER): Payer: Medicaid Other | Admitting: Certified Nurse Midwife

## 2017-11-19 ENCOUNTER — Other Ambulatory Visit (HOSPITAL_COMMUNITY)
Admission: RE | Admit: 2017-11-19 | Discharge: 2017-11-19 | Disposition: A | Discharge status: Home / Self Care | Payer: Medicaid Other | Source: Ambulatory Visit | Attending: Certified Nurse Midwife | Admitting: Certified Nurse Midwife

## 2017-11-19 ENCOUNTER — Encounter: Payer: Self-pay | Admitting: Certified Nurse Midwife

## 2017-11-19 VITALS — BP 114/80 | HR 74 | Ht 65.0 in | Wt 147.2 lb

## 2017-11-19 DIAGNOSIS — Z01419 Encounter for gynecological examination (general) (routine) without abnormal findings: Secondary | ICD-10-CM | POA: Insufficient documentation

## 2017-11-19 DIAGNOSIS — Z3202 Encounter for pregnancy test, result negative: Secondary | ICD-10-CM | POA: Diagnosis not present

## 2017-11-19 DIAGNOSIS — N898 Other specified noninflammatory disorders of vagina: Secondary | ICD-10-CM | POA: Insufficient documentation

## 2017-11-19 DIAGNOSIS — Z332 Encounter for elective termination of pregnancy: Secondary | ICD-10-CM

## 2017-11-19 DIAGNOSIS — Z0000 Encounter for general adult medical examination without abnormal findings: Secondary | ICD-10-CM | POA: Diagnosis not present

## 2017-11-19 DIAGNOSIS — Z30011 Encounter for initial prescription of contraceptive pills: Secondary | ICD-10-CM

## 2017-11-19 LAB — POCT URINE PREGNANCY: Preg Test, Ur: NEGATIVE

## 2017-11-19 MED ORDER — NORETHIN-ETH ESTRAD-FE BIPHAS 1 MG-10 MCG / 10 MCG PO TABS: 1.0000 | ORAL_TABLET | Freq: Every day | ORAL | 4 refills | Status: AC

## 2017-11-19 NOTE — Progress Notes (Signed)
Presents for AEX/PAP/Cultures.  C/o of white discharge, itching, lower abdominal pain, lower back pain.  Denies burning, urinary frequency. UPT today is NEGATIVE.

## 2017-11-19 NOTE — Progress Notes (Signed)
Subjective:        Alyssa Mcbride is a 27 y.o. female here for a routine exam.  Current complaints: needs birth control.  Had a TAB 4 weeks ago.  Was approximately [redacted] weeks pregnant at the time.  Her child's father is not paying for child support; therefore her medicaid will be inactive at the end of the month and she will not have insurance.  Was on OCPs: Balcoltra but did not like the side effects so she stopped taking them.  Discussed various forms of birth control.  Has not been sexually active since before the abortion.  Has 21 year old son, works and is well adjusted otherwise.  Reviewed all forms of birth control options available including abstinence; fertility period awareness methods; over the counter/barrier methods; hormonal contraceptive medication including pill, patch, ring, injection,contraceptive implant; hormonal and nonhormonal IUDs; permanent sterilization options including vasectomy and the various tubal sterilization modalities. Risks and benefits reviewed.  Questions were answered.  Information was given to patient to review.  After some time patient decided to stay with OCPs.  LoLo sent to the pharmacy.      Personal health questionnaire:  Is patient Ashkenazi Jewish, have a family history of breast and/or ovarian cancer: no Is there a family history of uterine cancer diagnosed at age < 61, gastrointestinal cancer, urinary tract cancer, family member who is a Personnel officer syndrome-associated carrier: no Is the patient overweight and hypertensive, family history of diabetes, personal history of gestational diabetes, preeclampsia or PCOS: no Is patient over 36, have PCOS,  family history of premature CHD under age 72, diabetes, smoke, have hypertension or peripheral artery disease:  no At any time, has a partner hit, kicked or otherwise hurt or frightened you?: no Over the past 2 weeks, have you felt down, depressed or hopeless?: no Over the past 2 weeks, have you felt little interest  or pleasure in doing things?:no   Gynecologic History Patient's last menstrual period was 11/04/2017 (exact date). Contraception: OCP (estrogen/progesterone) Last Pap: unknown.  Last mammogram: n/a <40 years and no significant family hx.   Obstetric History OB History  Gravida Para Term Preterm AB Living  2 1 1  0 1 1  SAB TAB Ectopic Multiple Live Births  0 1 0 0 1    # Outcome Date GA Lbr Len/2nd Weight Sex Delivery Anes PTL Lv  2 Term 09/28/14 [redacted]w[redacted]d 02:52 / 03:25 7 lb 10.9 oz (3.484 kg) M Vag-Spont EPI  LIV     Birth Comments: Hgb, Normal, FA Newborn Screen Barcode: [id number] Date Collected: 09/30/2014  1 TAB             Past Medical History:  Diagnosis Date  . Anxiety   . BV (bacterial vaginosis)   . 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 BUNIONECTOMY Bilateral      Current Outpatient Medications:  .  Levonorgest-Eth Estrad-Fe Bisg (BALCOLTRA) 0.1-20 MG-MCG(21) TABS, Take 1 tablet by mouth daily. (Patient not taking: Reported on 11/19/2017), Disp: 28 tablet, Rfl: 12 .  lisdexamfetamine (VYVANSE) 20 MG capsule, Take 20 mg by mouth daily., Disp: , Rfl:  .  Norethindrone-Ethinyl Estradiol-Fe Biphas (LO LOESTRIN FE) 1 MG-10 MCG / 10 MCG tablet, Take 1 tablet by mouth daily. Take 1 tablet by mouth daily., Disp: 3 Package, Rfl: 4 .  Prenat-FeAsp-Meth-FA-DHA w/o A (PRENATE PIXIE) 10-0.6-0.4-200 MG CAPS, Take 1 tablet by mouth daily. (  Patient not taking: Reported on 11/19/2017), Disp: 30 capsule, Rfl: 12 .  terconazole (TERAZOL 3) 0.8 % vaginal cream, Place 1 applicator vaginally at bedtime. (Patient not taking: Reported on 11/19/2017), Disp: 20 g, Rfl: 0 .  trimethoprim-polymyxin b (POLYTRIM) ophthalmic solution, Place 1 drop into both eyes every 4 (four) hours. (Patient not taking: Reported on 11/19/2017), Disp: 10 mL, Rfl: 0 Allergies  Allergen Reactions  . Flagyl [Metronidazole] Itching     Social History   Tobacco Use  . Smoking status: Never Smoker  . Smokeless tobacco: Never Used  Substance Use Topics  . Alcohol use: Yes    Comment: socially    Family History  Problem Relation Age of Onset  . Hypertension Mother   . Heart attack Neg Hx       Review of Systems  Constitutional: negative for fatigue and weight loss Respiratory: negative for cough and wheezing Cardiovascular: negative for chest pain, fatigue and palpitations Gastrointestinal: negative for abdominal pain and change in bowel habits Musculoskeletal:negative for myalgias Neurological: negative for gait problems and tremors Behavioral/Psych: negative for abusive relationship, depression Endocrine: negative for temperature intolerance    Genitourinary:negative for abnormal menstrual periods, genital lesions, hot flashes, sexual problems and vaginal discharge Integument/breast: negative for breast lump, breast tenderness, nipple discharge and skin lesion(s)    Objective:       BP 114/80   Pulse 74   Ht 5\' 5"  (1.651 m)   Wt 147 lb 3.2 oz (66.8 kg)   LMP 11/04/2017 (Exact Date)   BMI 24.50 kg/m  General:   alert  Skin:   no rash or abnormalities  Lungs:   clear to auscultation bilaterally  Heart:   regular rate and rhythm, S1, S2 normal, no murmur, click, rub or gallop  Breasts:   normal without suspicious masses, skin or nipple changes or axillary nodes  Abdomen:  normal findings: no organomegaly, soft, non-tender and no hernia  Pelvis:  External genitalia: normal general appearance Urinary system: urethral meatus normal and bladder without fullness, nontender Vaginal: normal without tenderness, induration or masses Cervix: normal appearance Adnexa: normal bimanual exam Uterus: anteverted and non-tender, normal size   Lab Review Urine pregnancy test Labs reviewed yes Radiologic studies reviewed no  50% of 30 min visit spent on counseling and coordination of care.   Assessment & Plan     Healthy female exam.    1. Well woman exam    - Cytology - PAP  2. Vaginal discharge    - Cervicovaginal ancillary only  3. Therapeutic abortion    - POCT urine pregnancy  4. Encounter for initial prescription of contraceptive pills      - Norethindrone-Ethinyl Estradiol-Fe Biphas (LO LOESTRIN FE) 1 MG-10 MCG / 10 MCG tablet; Take 1 tablet by mouth daily. Take 1 tablet by mouth daily.  Dispense: 3 Package; Refill: 4   Education reviewed: calcium supplements, depression evaluation, low fat, low cholesterol diet, safe sex/STD prevention, self breast exams, skin cancer screening and weight bearing exercise. Contraception: OCP (estrogen/progesterone). Follow up in: PRN pending insurance months.   Meds ordered this encounter  Medications  . Norethindrone-Ethinyl Estradiol-Fe Biphas (LO LOESTRIN FE) 1 MG-10 MCG / 10 MCG tablet    Sig: Take 1 tablet by mouth daily. Take 1 tablet by mouth daily.    Dispense:  3 Package    Refill:  4    BIN # [id number], PCN# [id number], [id number], ID#: [id number]: [id number]   Orders Placed This Encounter  Procedures  . POCT  urine pregnancy    Possible management options include:Kyleena Follow up as needed.

## 2017-11-20 ENCOUNTER — Telehealth: Payer: Self-pay

## 2017-11-20 LAB — CERVICOVAGINAL ANCILLARY ONLY
Bacterial vaginitis: NEGATIVE
CHLAMYDIA, DNA PROBE: NEGATIVE
Candida vaginitis: NEGATIVE
NEISSERIA GONORRHEA: NEGATIVE
Trichomonas: NEGATIVE

## 2017-11-20 NOTE — Telephone Encounter (Signed)
Returned call and pt wanted to know if rx was sent, advised that lab results are not in.

## 2017-11-24 LAB — CYTOLOGY - PAP: Diagnosis: NEGATIVE

## 2017-11-25 ENCOUNTER — Other Ambulatory Visit: Payer: Self-pay | Admitting: Certified Nurse Midwife

## 2018-03-03 ENCOUNTER — Ambulatory Visit: Payer: Medicaid Other

## 2018-03-04 ENCOUNTER — Ambulatory Visit: Payer: Medicaid Other

## 2018-04-14 ENCOUNTER — Other Ambulatory Visit: Payer: Self-pay

## 2018-04-14 DIAGNOSIS — N898 Other specified noninflammatory disorders of vagina: Secondary | ICD-10-CM

## 2018-04-14 MED ORDER — FLUCONAZOLE 150 MG PO TABS: 150.0000 mg | ORAL_TABLET | Freq: Once | ORAL | 0 refills | Status: AC

## 2018-04-14 NOTE — Progress Notes (Signed)
Pt called in stating she is having symptoms of a yeast infection (discharge and itching) for a couple days now. Pt requested rx diflucan be sent in to pharmacy. I advised pt I would send in diflucan and to call back if symptoms persist. Pt verbalized understanding.

## 2018-08-09 ENCOUNTER — Ambulatory Visit: Payer: Medicaid Other

## 2019-02-17 ENCOUNTER — Ambulatory Visit: Payer: Self-pay | Admitting: Internal Medicine

## 2019-02-21 ENCOUNTER — Ambulatory Visit: Payer: Self-pay

## 2020-02-09 ENCOUNTER — Ambulatory Visit: Attending: Family | Primary: Internal Medicine

## 2020-02-09 ENCOUNTER — Ambulatory Visit
Admit: 2020-02-09 | Discharge: 2020-02-09 | Payer: BLUE CROSS/BLUE SHIELD | Attending: Family | Primary: Student in an Organized Health Care Education/Training Program

## 2020-02-09 DIAGNOSIS — N898 Other specified noninflammatory disorders of vagina: Secondary | ICD-10-CM

## 2020-02-09 LAB — AMB POC SMEAR, STAIN & INTERPRET, WET MOUNT

## 2020-02-09 MED ORDER — FLUCONAZOLE 150 MG TAB
150 mg | ORAL_TABLET | ORAL | 0 refills | Status: DC
Start: 2020-02-09 — End: 2020-05-21

## 2020-02-09 NOTE — Progress Notes (Signed)
The patient is a 29 y.o. No obstetric history on file who is seen for possible bump on the anal area. Pt states it does not hurt, but feels as though when her PH balance is off, anal area becomes inflamed and sometimes itchy. Denies fevers. Pt denies drainage. Pt denies new sexual partners. States occasionally will have issues with constipation, but has been a while since this was a problem for her. Denies pain/bleeding with BM. States was googling and thinks she may have anal fissure. Notes mild vaginal itching. Denies vaginal discharge or odor. Denies pelvic/abd/flank pain. Denies pain/bleeding with intercourse. Denies burning/frequency with urination. Denies use of scented soaps/products vaginally. [facility] and was positive for trich. TOC was negative after tx with flagyl.         HISTORY:    No obstetric history on file.  LMP: 01/16/2020  Sexual History:  has sex with males  Contraception:  none  No current outpatient medications on file prior to visit.     No current facility-administered medications on file prior to visit.       ROS:  Feeling well. No dyspnea or chest pain on exertion.  No abdominal pain, change in bowel habits, black or bloody stools.  No urinary tract symptoms. GYN ROS: see.    PHYSICAL EXAM:  Blood pressure 106/60, height 5\' 5"  (1.651 m), weight 140 lb (63.5 kg), last menstrual period 01/16/2020.    The patient appears well, alert, oriented x 3, in no distress.  Lungs are clear. Heart RRR, no murmurs. Abdomen soft without tenderness, guarding, mass or organomegaly.  Pelvic: VULVA: normal appearing vulva with no masses, tenderness or lesions, VAGINA: normal appearing vagina with normal color and discharge, no lesions, CERVIX: normal appearing cervix without discharge or lesions, UTERUS: uterus is normal size, shape, consistency and nontender, ADNEXA: normal adnexa in size, nontender and no masses, RECTAL: normal rectal, no masses.    ASSESSMENT:  Encounter  Diagnoses   Name Primary?   ??? Vaginal itching Yes   ??? Anal itch        PLAN:  All questions answered.  Wet prep with yeast  Rectal exam benign- recommend calling if symptoms return as she's not really experiencing symptoms today  Diflucan to pharmacy for yeast  Stool softeners/gentle laxatives ok to prevent constipation  Sits baths ok   Explained to pt, that while not visible today, if anal fissue, this will often heal with time  Conservative management otherwise recommended   To call with any further questions/concerns.     Orders Placed This Encounter   ??? Wet Prep - [facility] (01/18/2020)   ??? fluconazole (DIFLUCAN) 150 mg tablet     Sig: Take 1 Tab PO Today and Take 1 Tab PO in 3 days.     Dispense:  2 Tablet     Refill:  0         Supervising physician is Dr. [id number]

## 2020-05-10 NOTE — Telephone Encounter (Signed)
Patient called and ask if we could perform DNA testing in pregnancy.  Patient advised we do not offer DNA testing in pregnancy, she would need to purchase DNA kit and can be performed at delivery.  Patient v/u.

## 2020-05-18 NOTE — Telephone Encounter (Signed)
Pt scheduled for aapt on 9-27. Per NP advised pt to use monistat over the weekend. Pt v/u

## 2020-05-18 NOTE — Telephone Encounter (Signed)
Pt has a positive pregnancy test. Pt c/o vaginal itching with white d/c. Pt thinks she may have a yeast infection and is requesting same day appt

## 2020-05-21 ENCOUNTER — Ambulatory Visit: Attending: Women's Health | Primary: Internal Medicine

## 2020-05-21 ENCOUNTER — Ambulatory Visit
Admit: 2020-05-21 | Discharge: 2020-05-21 | Payer: BLUE CROSS/BLUE SHIELD | Attending: Women's Health | Primary: Student in an Organized Health Care Education/Training Program

## 2020-05-21 DIAGNOSIS — N898 Other specified noninflammatory disorders of vagina: Secondary | ICD-10-CM

## 2020-05-21 LAB — AMB POC URINALYSIS DIP STICK AUTO W/O MICRO
Bilirubin (UA POC): NEGATIVE
Bilirubin, Urine, POC: NEGATIVE
Ketones (UA POC): NEGATIVE
Ketones, Urine, POC: NEGATIVE
Nitrite, Urine, POC: NEGATIVE
Nitrites (UA POC): NEGATIVE
Protein (UA POC): NEGATIVE
Protein, Urine, POC: NEGATIVE
Specific Gravity, Urine, POC: 1.015 NA (ref 1.001–1.035)
Specific gravity (UA POC): 1.015 (ref 1.001–1.035)
Urobilinogen (UA POC): 0.2 (ref 0.2–1)
Urobilinogen, POC: 0.2 (ref 0.2–1)
pH (UA POC): 7 (ref 4.6–8.0)
pH, Urine, POC: 7 NA (ref 4.6–8.0)

## 2020-05-21 LAB — AMB POC SMEAR, STAIN & INTERPRET, WET MOUNT
WET MOUNT POCT, WMPOCT: NEGATIVE
Wet mount (POC): NEGATIVE

## 2020-05-21 LAB — AMB POC URINE PREGNANCY TEST, VISUAL COLOR COMPARISON
HCG urine, Ql. (POC): POSITIVE
HCG, Pregnancy, Urine, POC: POSITIVE

## 2020-05-21 NOTE — Progress Notes (Signed)
OB pt with uti  Send rx macrobid 100mg  1 po bid x5d #10  Cx next visit

## 2020-05-21 NOTE — Progress Notes (Signed)
The patient is a 29 y.o. G2P1001 who is seen to discuss her new pregnancy. She is not currently taking an over the counter PNV with DHA and folic acid. Admits to lower abdominal cramping x past few weeks as well as dysuria that happens occasionally for the past week with some constipation. No hematuria or frequency. Cramping is overall midline but sometimes does extend into LLQ/RLQ. She is concerned about the cramping and urinary sx.    States last week she noticed white vaginal discharge without irritation or odor. Symptoms have resolved but she would still like to be examined.    LMP: 04/01/20  EDD based off of LMP: 01/06/21  GA today: [redacted]w[redacted]d    HISTORY:    G1 P1001  Patient's last menstrual period was 04/01/2020 (exact date).    Current Outpatient Medications on File Prior to Visit   Medication Sig Dispense Refill   ??? [DISCONTINUED] fluconazole (DIFLUCAN) 150 mg tablet Take 1 Tab PO Today and Take 1 Tab PO in 3 days. 2 Tablet 0     No current facility-administered medications on file prior to visit.       ROS:  Feeling well. No dyspnea or chest pain on exertion.  No abdominal pain, change in bowel habits, black or bloody stools.  No urinary tract symptoms. GYN ROS: she complains of cramping in early pregnancy, dysuria and discharge.    PHYSICAL EXAM:  Blood pressure 106/60, height 5\' 5"  (1.651 m), weight 144 lb 6.4 oz (65.5 kg), last menstrual period 04/01/2020.    The patient appears well, alert, oriented x 3, in no distress.  Lungs are clear. Heart RRR, no murmurs. Abdomen soft without tenderness, guarding, mass or organomegaly.  Pelvic: VULVA: normal appearing vulva with no masses, tenderness or lesions, VAGINA: normal appearing vagina with normal color and discharge, no lesions, CERVIX: normal appearing cervix without discharge or lesions.    Wet prep: no pathogens    ASSESSMENT:  Encounter Diagnoses   Name Primary?   ??? Vaginal irritation Yes   ??? Positive pregnancy test    ??? Dysuria    ??? Screening examination for  venereal disease    ??? Screening for viral and chlamydial diseases        PLAN:  Dos and Don'ts of pregnancy discussed.  Pain, bleeding, and ectopic precautions given.  She denies unilateral pain at this time but is very concerned over her cramping that has been persistent. Disc cramping can be normal in early pregnancy.   Will have her return tomorrow for TAB 06/01/2020 to evaluate her concerns.   Check urine cx  Check gc/chl/trich  Reassured of neg wet prep.  start PNV.          Orders Placed This Encounter   ??? Urine Culture   ??? Ct/Ng/Tv     Order Specific Question:   Specimen source     Answer:   Urine [258]   ??? Pregnancy Test, Urine ([place])   ??? Wet Prep - [facility] (36644)   ??? Urinalysis, Auto w/o Micro [phone])           Supervising physician is Dr. ([id number].  Greater than 50% of the 20 minute visit were spent in counseling to the above topics.

## 2020-05-22 ENCOUNTER — Encounter
Admit: 2020-05-22 | Discharge: 2020-05-22 | Payer: BLUE CROSS/BLUE SHIELD | Primary: Student in an Organized Health Care Education/Training Program

## 2020-05-22 ENCOUNTER — Ambulatory Visit
Admit: 2020-05-22 | Discharge: 2020-05-22 | Payer: BLUE CROSS/BLUE SHIELD | Attending: Women's Health | Primary: Student in an Organized Health Care Education/Training Program

## 2020-05-22 ENCOUNTER — Ambulatory Visit: Attending: Women's Health | Primary: Internal Medicine

## 2020-05-22 DIAGNOSIS — O200 Threatened abortion: Secondary | ICD-10-CM

## 2020-05-22 NOTE — Progress Notes (Signed)
EOB

## 2020-05-22 NOTE — Progress Notes (Signed)
This encounter was created in error - please disregard.

## 2020-05-23 ENCOUNTER — Ambulatory Visit
Admit: 2020-05-23 | Discharge: 2020-05-23 | Payer: BLUE CROSS/BLUE SHIELD | Attending: Women's Health | Primary: Student in an Organized Health Care Education/Training Program

## 2020-05-23 ENCOUNTER — Ambulatory Visit: Attending: Women's Health | Primary: Internal Medicine

## 2020-05-23 DIAGNOSIS — Z3490 Encounter for supervision of normal pregnancy, unspecified, unspecified trimester: Secondary | ICD-10-CM

## 2020-05-23 LAB — CT/NG/T.VAGINALIS AMPLIFICATION
C. trachomatis by NAA: NEGATIVE
CHLAMYDIA BY NAA, 183161: NEGATIVE
GONOCOCCUS BY NAA, 183162: NEGATIVE
N. gonorrhoeae by NAA: NEGATIVE
T. vaginalis by NAA: NEGATIVE
TRICH VAG BY NAA: NEGATIVE

## 2020-05-23 MED ORDER — ONDANSETRON HCL 8 MG TAB
8 mg | ORAL_TABLET | Freq: Three times a day (TID) | ORAL | 2 refills | Status: DC | PRN
Start: 2020-05-23 — End: 2020-10-24

## 2020-05-23 NOTE — Progress Notes (Signed)
Subjective:      Sandra Lewis is a 29 y.o.G2 P1001 who presents to discuss her TAB ultrasound performed 05/22/20.  Yesterday had c/o pelvic cramping which has continued today. Urine cx pending.   She denies vaginal bleeding.   She reports increase in nausea sx--she is able to keep down food and drink with no problem but is asking for medication to help as needed.     She is concerned as she used vag boric acid several weeks ago and did not know she was pregnant, as well as several doses of Plan B before she knew she was pregnant.    Ultrasound Findings 05/22/20:  Single IUP with +FHT = 132 bpm  CRL is not consistent with EDC determined by LMP. CRL = [redacted]w[redacted]d with an EDD = 01-07-21  YS visualized  ROV visualized with follicles  LOV visualized with follicles and probable CL  Uterus is anteverted  Cx appears WNL    There are 2 [facility] noted 1- 1.5x0.4x0.5cm R superior 2-0.9x0.3x0.3cm inferior to GS.     History reviewed. No pertinent past medical history.  History reviewed. No pertinent surgical history.   Family History   Problem Relation Age of Onset   ??? Hypertension Mother      Social History     Tobacco Use   ??? Smoking status: Never Smoker   ??? Smokeless tobacco: Never Used   Substance Use Topics   ??? Alcohol use: Not Currently      Prior to Admission medications    Medication Sig Start Date End Date Taking? Authorizing Provider   ondansetron hcl (ZOFRAN) 8 mg tablet Take 1 Tablet by mouth every eight (8) hours as needed for Nausea or Vomiting. 05/23/20  Yes Randa Lynn, NP        No Known Allergies  Rh status:unknown    Review of Systems:  A comprehensive review of systems was negative except for that written in the History of Present Illness.      Objective:     Vitals:    05/23/20 0819   BP: 110/64   Weight: 146 lb 3.2 oz (66.3 kg)   Height: 5\' 5"  (1.651 m)        Ultrasound results:    Physical Exam:  GENERAL: alert, cooperative, no distress, appears stated age    Assessment:     1. Early stage of  pregnancy      2. Pelvic pain      3. Nausea and vomiting during pregnancy          Plan:     Orders Placed This Encounter   ??? ondansetron hcl (ZOFRAN) 8 mg tablet     Sig: Take 1 Tablet by mouth every eight (8) hours as needed for Nausea or Vomiting.     Dispense:  20 Tablet     Refill:  2     Reviewed findings with pt. Reassured of IUP with +FHT.   Disc 2 small SCH--pt is asymptomatic but is given bleeding precautions.   Reassured on concerns re: boric acid and plan b use prior to her knowledge of the pregnancy.   rx zofran 74m for prn use.   ED if unable to keep down fluids x12hrs.   FU for nob talk/US 1-2 wks.      Supervising physician is Dr. 4m  Greater than 50% of this 20 minute visit was spent in counseling to the above topics.

## 2020-05-25 LAB — CULTURE, URINE

## 2020-05-25 MED ORDER — NITROFURANTOIN (25% MACROCRYSTAL FORM) 100 MG CAP
100 mg | ORAL_CAPSULE | Freq: Two times a day (BID) | ORAL | 0 refills | Status: AC
Start: 2020-05-25 — End: 2020-05-30

## 2020-05-25 NOTE — Telephone Encounter (Signed)
-----   Message from Randa Lynn, NP sent at 05/25/2020 12:09 PM EDT -----  OB pt with uti  Send rx macrobid 100mg  1 po bid x5d #10  Cx next visit

## 2020-05-25 NOTE — Telephone Encounter (Signed)
 Attempted to contact patient about UTI. No answer. VM box full. Mychart not activated.  Will send in medication to pharmacy so patient can hopefully fill over the weekend.    Orders Placed This Encounter   . nitrofurantoin, macrocrystal-monohydrate, (MACROBID) 100 mg capsule     Sig: Take 1 Capsule by mouth two (2) times a day for 5 days.     Dispense:  10 Capsule     Refill:  0

## 2020-05-28 NOTE — Telephone Encounter (Signed)
Spoke with patient-she is informed that Rx sent to pharmacy.

## 2020-05-28 NOTE — Telephone Encounter (Signed)
-----   Message from Danielle N Andrews, NP sent at 05/25/2020 12:09 PM EDT -----  OB pt with uti  Send rx macrobid 100mg 1 po bid x5d #10  Cx next visit

## 2020-06-06 ENCOUNTER — Ambulatory Visit: Primary: Internal Medicine

## 2020-06-06 ENCOUNTER — Ambulatory Visit
Admit: 2020-06-06 | Discharge: 2020-06-06 | Payer: BLUE CROSS/BLUE SHIELD | Primary: Student in an Organized Health Care Education/Training Program

## 2020-06-06 ENCOUNTER — Encounter
Admit: 2020-06-06 | Discharge: 2020-06-06 | Payer: BLUE CROSS/BLUE SHIELD | Primary: Student in an Organized Health Care Education/Training Program

## 2020-06-06 DIAGNOSIS — Z3481 Encounter for supervision of other normal pregnancy, first trimester: Secondary | ICD-10-CM

## 2020-06-06 LAB — HEMOGLOBIN FRACTIONATION, EXTERNAL
HGB EVAL, EXTERNAL: NEGATIVE
Hemoglobin eval., External: NEGATIVE

## 2020-06-06 LAB — RUBELLA AB, IGG, EXTERNAL
RUBELLA, EXTERNAL: IMMUNE
Rubella, External: IMMUNE

## 2020-06-06 LAB — ANTIBODY SCREEN, EXTERNAL
ANTIBODY SCREEN, EXTERNAL: NEGATIVE
Antibody screen, External: NEGATIVE

## 2020-06-06 LAB — URINALYSIS W/O MICRO, EXTERNAL
URINALYSIS, EXTERNAL: NO GROWTH
Urine, External: NO GROWTH

## 2020-06-06 LAB — RPR, EXTERNAL

## 2020-06-06 LAB — TYPE, ABO & RH, EXTERNAL
ABO,Rh: A POS
TYPE, ABO & RH, EXTERNAL: A POS

## 2020-06-06 LAB — HEPATITIS B SURFACE AG, EXTERNAL
HBSAG, EXTERNAL: NEGATIVE
HBsAg, External: NEGATIVE

## 2020-06-06 LAB — HEPATITIS C AB, EXTERNAL
HEPATITIS C AB,   EXT: NEGATIVE
Hepatitis C Ab, External: NEGATIVE

## 2020-06-06 LAB — HIV-1 AB, EXTERNAL

## 2020-06-06 LAB — HEMOGLOBIN, EXTERNAL
HGB, EXTERNAL: 13 NA
Hgb, External: 13

## 2020-06-06 LAB — PLATELET, EXTERNAL
PLATELET CNT,   EXTERNAL: 240 NA
Platelet cnt., External: 240

## 2020-06-06 LAB — HEMATOCRIT, EXTERNAL
HCT, EXTERNAL: 40.5 NA
Hct, External: 40.5

## 2020-06-06 NOTE — Progress Notes (Signed)
Sandra Lewis, G3, P1 presents to the office today for NOB talk and ultrasound. EDC is 01/07/21 based off of [place]. Patient education was discussed including: nutrition, appropriate weight gain, diet, exercise, travel, hospital classes, breastfeeding/lactation services, flu vaccine, Tdap, glucola, GBS, and Corona Virus and Zika precautions. Genetic testing discussed in depth and patient is undecided. Patient states last pregnancy and delivery were normal.   She is to return to the office in 3 weeks for NOB exam. All questions answered and she voiced full understanding. She is encouraged to call the office with any questions or concerns.

## 2020-06-06 NOTE — Progress Notes (Signed)
OB labs

## 2020-06-06 NOTE — Progress Notes (Signed)
EOB FOLLOW UP TA

## 2020-06-08 LAB — CBC WITH AUTO DIFFERENTIAL
Basophils %: 0 %
Basophils Absolute: 0 10*3/uL (ref 0.0–0.2)
Eosinophils %: 1 %
Eosinophils Absolute: 0.1 10*3/uL (ref 0.0–0.4)
Granulocyte Absolute Count: 0 10*3/uL (ref 0.0–0.1)
Hematocrit: 40.5 % (ref 34.0–46.6)
Hemoglobin: 13 g/dL (ref 11.1–15.9)
Immature Granulocytes: 0 %
Lymphocytes %: 22 %
Lymphocytes Absolute: 1.6 10*3/uL (ref 0.7–3.1)
MCH: 27.6 pg (ref 26.6–33.0)
MCHC: 32.1 g/dL (ref 31.5–35.7)
MCV: 86 fL (ref 79–97)
Monocytes %: 6 %
Monocytes Absolute: 0.4 10*3/uL (ref 0.1–0.9)
Neutrophils %: 71 %
Neutrophils Absolute: 5.1 10*3/uL (ref 1.4–7.0)
Platelets: 240 10*3/uL (ref 150–450)
RBC: 4.71 x10E6/uL (ref 3.77–5.28)
RDW: 12.6 % (ref 11.7–15.4)
WBC: 7.3 10*3/uL (ref 3.4–10.8)

## 2020-06-08 LAB — HEPATITIS B SURFACE ANTIGEN: Hepatitis B Surface Ag: NEGATIVE

## 2020-06-08 LAB — HEMOGLOBIN FRACTIONATION
HEMOGLOBIN A2: 2.5 % (ref 1.8–3.2)
HEMOGLOBIN F: 0.3 % (ref 0.0–2.0)
HEMOGLOBIN S: 0 %
HGB A: 97.2 % (ref 96.4–98.8)
Hemoglobin A2: 2.5 % (ref 1.8–3.2)
Hemoglobin A: 97.2 % (ref 96.4–98.8)
Hemoglobin F: 0.3 % (ref 0.0–2.0)
Hemoglobin S: 0 %

## 2020-06-08 LAB — CULTURE, URINE
Urine Culture, Routine: NO GROWTH
Urine Culture, Routine: NO GROWTH

## 2020-06-08 LAB — HCV AB W/RFLX TO NAA
HCV AB, 144035: <0.1 s/co ratio (ref 0.0–0.9)
HCV Ab: <0.1 s/co ratio (ref 0.0–0.9)

## 2020-06-08 LAB — RPR
RPR: NONREACTIVE
RPR: NONREACTIVE

## 2020-06-08 LAB — HCV INTERPRETATION

## 2020-06-08 LAB — ANTIBODY SCREEN
Antibody Screen: NEGATIVE
Antibody screen: NEGATIVE

## 2020-06-08 LAB — BLOOD TYPE, (ABO+RH)
Rh (D): POSITIVE
Rh Type: POSITIVE

## 2020-06-08 LAB — RUBELLA AB, IGG
RUBELLA ANTIBODIES, IGG, 006201: 3.2 index (ref >0.99)
Rubella Ab, IgG: 3.2 index (ref >0.99)

## 2020-06-08 LAB — CBC WITH AUTOMATED DIFF
ABS. BASOPHILS: 0 10*3/uL (ref 0.0–0.2)
ABS. EOSINOPHILS: 0.1 10*3/uL (ref 0.0–0.4)
ABS. IMM. GRANS.: 0 10*3/uL (ref 0.0–0.1)
ABS. MONOCYTES: 0.4 10*3/uL (ref 0.1–0.9)
ABS. NEUTROPHILS: 5.1 10*3/uL (ref 1.4–7.0)
Abs Lymphocytes: 1.6 10*3/uL (ref 0.7–3.1)
BASOPHILS: 0 %
EOSINOPHILS: 1 %
HCT: 40.5 % (ref 34.0–46.6)
HGB: 13 g/dL (ref 11.1–15.9)
IMMATURE GRANULOCYTES: 0 %
Lymphocytes: 22 %
MCH: 27.6 pg (ref 26.6–33.0)
MCHC: 32.1 g/dL (ref 31.5–35.7)
MCV: 86 fL (ref 79–97)
MONOCYTES: 6 %
NEUTROPHILS: 71 %
PLATELET: 240 10*3/uL (ref 150–450)
RBC: 4.71 x10E6/uL (ref 3.77–5.28)
RDW: 12.6 % (ref 11.7–15.4)
WBC: 7.3 10*3/uL (ref 3.4–10.8)

## 2020-06-08 LAB — HEP B SURFACE AG: Hep B surface Ag screen: NEGATIVE

## 2020-06-08 LAB — HIV 1/2 AG/AB, 4TH GENERATION,W RFLX CONFIRM: HIV SCREEN 4TH GENERATION WRFX: NONREACTIVE

## 2020-06-08 LAB — HIV 1/2 ANTIGEN/ANTIBODY, FOURTH GENERATION W/RFL: HIV Screen 4th Generation wRfx: NONREACTIVE

## 2020-07-02 ENCOUNTER — Other Ambulatory Visit
Admit: 2020-07-02 | Discharge: 2020-07-02 | Payer: BLUE CROSS/BLUE SHIELD | Attending: Obstetrics & Gynecology | Primary: Student in an Organized Health Care Education/Training Program

## 2020-07-02 ENCOUNTER — Other Ambulatory Visit: Attending: Obstetrics & Gynecology | Primary: Internal Medicine

## 2020-07-02 DIAGNOSIS — Z113 Encounter for screening for infections with a predominantly sexual mode of transmission: Secondary | ICD-10-CM

## 2020-07-02 LAB — PAP SMEAR, EXTERNAL
PAP Smear, External: NEGATIVE
Pap smear, External: NEGATIVE

## 2020-07-02 LAB — AMB POC OB URINE DIP
Glucose (UA POC): NEGATIVE
Glucose, Urine, POC: NEGATIVE
Protein (UA POC): NEGATIVE
Protein, Urine, POC: NEGATIVE

## 2020-07-02 LAB — CHLAMYDIA DNA PROBE, EXTERNAL
Chlamydia, External Result: NEGATIVE
Chlamydia, External: NEGATIVE

## 2020-07-02 LAB — N GONORRHOEAE, DNA PROBE, EXTERNAL
Gonorrhea, External: NEGATIVE
N. GONORRHEA, EXTERNAL: NEGATIVE

## 2020-07-02 NOTE — Progress Notes (Signed)
Patient presents today for her initial OB exam.    Patient's last menstrual period was 03/28/2020.  Estimated Date of Delivery: 01/07/21  OB History:   OB History   Gravida Para Term Preterm AB Living   3 1 1  0 1 1   SAB IAB Ectopic Molar Multiple Live Births   0 0 0 0 0 1      # Outcome Date GA Lbr Len/2nd Weight Sex Delivery Anes PTL Lv   3 Current            2 Term 09/28/14 [redacted]w[redacted]d  7 lb 6 oz (3.345 kg) M Vag-Spont EPI  LIV   1 AB                Past Gyn History:   GYN History               Allergies:   No Known Allergies    Medication History:  Current Outpatient Medications   Medication Sig Dispense Refill   ??? prenatal vit 91/iron/folic/dha (PRENATAL + DHA PO) Take  by mouth.     ??? ondansetron hcl (ZOFRAN) 8 mg tablet Take 1 Tablet by mouth every eight (8) hours as needed for Nausea or Vomiting. 20 Tablet 2       Past Medical History:  Past Medical History:   Diagnosis Date   ??? Anxiety and depression     meds in past       Past Surgical History:  No past surgical history on file.    Social History:  Social History     Socioeconomic History   ??? Marital status: SINGLE     Spouse name: Not on file   ??? Number of children: Not on file   ??? Years of education: Not on file   ??? Highest education level: Not on file   Occupational History   ??? Not on file   Tobacco Use   ??? Smoking status: Never Smoker   ??? Smokeless tobacco: Never Used   Substance and Sexual Activity   ??? Alcohol use: Not Currently   ??? Drug use: Never   ??? Sexual activity: Yes     Partners: Male     Birth control/protection: None   Other Topics Concern   ??? Not on file   Social History Narrative   ??? Not on file     Social Determinants of Health     Financial Resource Strain:    ??? Difficulty of Paying Living Expenses: Not on file   Food Insecurity:    ??? Worried About Running Out of Food in the Last Year: Not on file   ??? Ran Out of Food in the Last Year: Not on file   Transportation Needs:    ??? Lack of Transportation (Medical): Not on file   ??? Lack of  Transportation (Non-Medical): Not on file   Physical Activity:    ??? Days of Exercise per Week: Not on file   ??? Minutes of Exercise per Session: Not on file   Stress:    ??? Feeling of Stress : Not on file   Social Connections:    ??? Frequency of Communication with Friends and Family: Not on file   ??? Frequency of Social Gatherings with Friends and Family: Not on file   ??? Attends Religious Services: Not on file   ??? Active Member of Clubs or Organizations: Not on file   ??? Attends Club or Organization Meetings: Not on file   ???  Marital Status: Not on file   Intimate Partner Violence:    ??? Fear of Current or Ex-Partner: Not on file   ??? Emotionally Abused: Not on file   ??? Physically Abused: Not on file   ??? Sexually Abused: Not on file   Housing Stability:    ??? Unable to Pay for Housing in the Last Year: Not on file   ??? Number of Places Lived in the Last Year: Not on file   ??? Unstable Housing in the Last Year: Not on file       Family History:  Family History   Problem Relation Age of Onset   ??? Hypertension Mother        Review of Systems:     A comprehensive review of systems was negative except for that written in the history of present illness.     Visit Vitals  BP 102/60   Wt 146 lb (66.2 kg)   LMP 03/28/2020   BMI 24.30 kg/m??       See initial OB exam      Assessment and Plan:     Diagnoses and all orders for this visit:    1. Routine screening for STI (sexually transmitted infection)  -     PAP IG, CT-NG-TV, RFX APTIMA HPV ASCUS ([id number])    2. Screening for cervical cancer  -     PAP IG, CT-NG-TV, RFX APTIMA HPV ASCUS ([id number])    3. Screening for genitourinary condition  -     AMB POC OB URINE DIP    4. [redacted] weeks gestation of pregnancy  -     AMB POC OB URINE DIP  -     PAP IG, CT-NG-TV, RFX APTIMA HPV ASCUS ([id number])    5. Prenatal care, subsequent pregnancy, second trimester  -     AMB POC OB URINE DIP  -     PAP IG, CT-NG-TV, RFX APTIMA HPV ASCUS ([id number])          Counseling was received  regarding adverse effects of alcohol, breast vs bottle feeding, childbirth classes, environment or work hazards, lifestyle changes, negative effects of smoking, physical activity, prenatal nutrition, sexual activity, toxoplasmosis precautions which includes avoidance of cat feces and raw material, traveling.  Discussed prenatal care and questions answered.  Discussed antenatal genetic testing options and she has not yet decided.

## 2020-07-02 NOTE — Progress Notes (Signed)
Pap smear is due today with std screening. No complaints. Undecided about NIPT testing today, will check with insurance first.

## 2020-07-05 LAB — PAP IG, CT-NG-TV, RFX APTIMA HPV ASCUS (199325)
CHLAMYDIA, NUC. ACID AMP, 186134: NEGATIVE
GONOCOCCUS, NUC. ACID AMP, 186135: NEGATIVE
LABCORP 019018: 0
TRICH VAG BY NAA: NEGATIVE

## 2020-07-05 LAB — PAP IG, CT-NG-TV, RFX APTIMA HPV ASCUS (183160,507800)
.: 0
Chlamydia, Nuc. Acid Amp: NEGATIVE
Gonococcus, Nuc. Acid Amp: NEGATIVE
Trich vag by NAA: NEGATIVE

## 2020-07-31 ENCOUNTER — Ambulatory Visit: Attending: Obstetrics & Gynecology | Primary: Internal Medicine

## 2020-07-31 ENCOUNTER — Ambulatory Visit
Admit: 2020-07-31 | Discharge: 2020-07-31 | Payer: BLUE CROSS/BLUE SHIELD | Attending: Obstetrics & Gynecology | Primary: Student in an Organized Health Care Education/Training Program

## 2020-07-31 DIAGNOSIS — Z3482 Encounter for supervision of other normal pregnancy, second trimester: Secondary | ICD-10-CM

## 2020-07-31 LAB — AMB POC OB URINE DIP
Glucose (UA POC): NEGATIVE
Glucose, Urine, POC: NEGATIVE
Protein (UA POC): NEGATIVE
Protein, Urine, POC: NEGATIVE

## 2020-07-31 NOTE — Progress Notes (Signed)
Patient declined flu vaccine

## 2020-07-31 NOTE — Progress Notes (Signed)
Discussed importance of flu vaccine in pregnancy.  Increased incidence and severity of flu in pregnant women including respiratory arrest requiring ventilator support in ICU.  Also discussed passive immunity to baby.  Strongly encouraged patient to receive vaccine.

## 2020-08-22 ENCOUNTER — Encounter: Payer: BLUE CROSS/BLUE SHIELD | Primary: Student in an Organized Health Care Education/Training Program

## 2020-08-22 ENCOUNTER — Encounter
Payer: BLUE CROSS/BLUE SHIELD | Attending: Obstetrics & Gynecology | Primary: Student in an Organized Health Care Education/Training Program

## 2020-08-23 ENCOUNTER — Ambulatory Visit
Admit: 2020-08-23 | Discharge: 2020-08-23 | Payer: BLUE CROSS/BLUE SHIELD | Attending: Obstetrics & Gynecology | Primary: Student in an Organized Health Care Education/Training Program

## 2020-08-23 ENCOUNTER — Encounter
Admit: 2020-08-23 | Discharge: 2020-08-23 | Payer: BLUE CROSS/BLUE SHIELD | Primary: Student in an Organized Health Care Education/Training Program

## 2020-08-23 ENCOUNTER — Ambulatory Visit: Attending: Obstetrics & Gynecology | Primary: Internal Medicine

## 2020-08-23 DIAGNOSIS — Z3482 Encounter for supervision of other normal pregnancy, second trimester: Secondary | ICD-10-CM

## 2020-08-23 LAB — AMB POC OB URINE DIP
Glucose (UA POC): NEGATIVE
Glucose, Urine, POC: NEGATIVE
Protein (UA POC): NEGATIVE
Protein, Urine, POC: NEGATIVE

## 2020-08-23 NOTE — Progress Notes (Signed)
SCREENING

## 2020-08-23 NOTE — Progress Notes (Signed)
Anatomy screening performed  +FHT= 136  EIF seen in right ventricle   Eccentric cord placental cord insertion noted  No other markers for aneuploidy visualized  All other visualized anatomy appears within normal limits.   Cervix>3 cm, no previa visualized.   Placenta location is anterior

## 2020-08-23 NOTE — Progress Notes (Signed)
Discussed [place] with normal findings

## 2020-08-30 NOTE — Telephone Encounter (Signed)
OB patient called and LVM stating she has additional questions about her ultrasound.  Called patient back, no answer.  LVM advising can call back or send in my chart message with questions.

## 2020-08-30 NOTE — Telephone Encounter (Signed)
Patient called back.  She ask questions about her first ultrasound and how her due date was determined, and if we could tell her when she conceived.  After reviewing [place] explained to patient we used [place] measurements for dating since there was a 5 day difference. No way to tell exact date of conception.  All questions answered, patient v/u.

## 2020-09-12 NOTE — Telephone Encounter (Signed)
OB patient calling [redacted]w[redacted]d pregnant with complaints of feeling increase in vaginal pressure, cramping like a period, and lower back pain.  Denies vaginal bleeding, LOF, no UTI sx, a little constipated.  She is feeling this about every 30 minutes.  Is taking tylenol.  Patient is advised to monitor.  If theses episodes occur 6 or more times in an hour, had vaginal bleeding, leaking fluid, signs of infection-burning with urination, fever, vaginal odor with discharge, go to [facility] for evaluation.  Recommend increasing water- at least 64 oz daily, take tylenol.  All questions answered, patient v/u.

## 2020-09-21 ENCOUNTER — Ambulatory Visit: Attending: Obstetrics & Gynecology | Primary: Internal Medicine

## 2020-09-21 ENCOUNTER — Ambulatory Visit
Admit: 2020-09-21 | Discharge: 2020-09-21 | Payer: MEDICAID | Attending: Obstetrics & Gynecology | Primary: Student in an Organized Health Care Education/Training Program

## 2020-09-21 DIAGNOSIS — Z1389 Encounter for screening for other disorder: Secondary | ICD-10-CM

## 2020-09-21 LAB — AMB POC OB URINE DIP
Glucose (UA POC): NEGATIVE
Glucose, Urine, POC: NEGATIVE
Protein (UA POC): NEGATIVE
Protein, Urine, POC: NEGATIVE

## 2020-09-21 NOTE — Progress Notes (Signed)
No complaints.  Has some lower pelvic pressure at times, but not currently

## 2020-09-21 NOTE — Progress Notes (Signed)
glucola next; discussed poor sleep, pelvic pressure and also headaches. Reviewed causes/remedies at length. PNVs ok, add colace/miralax for constipation

## 2020-10-01 NOTE — Telephone Encounter (Signed)
OB patient called [redacted] weeks pregnant.  She is calling with sx of yeast infection.  Complains of thick white clumpy discharge.  No odor.  Vaginal itching.  She has tried anything for this.  Patient advised would recommend trying OTC Monistat.  If this does not help improve sx, call back for appt.  All questions answered, patient v/u.

## 2020-10-19 ENCOUNTER — Encounter: Payer: PRIVATE HEALTH INSURANCE | Primary: Student in an Organized Health Care Education/Training Program

## 2020-10-19 ENCOUNTER — Encounter
Payer: PRIVATE HEALTH INSURANCE | Attending: Obstetrics & Gynecology | Primary: Student in an Organized Health Care Education/Training Program

## 2020-10-23 NOTE — Telephone Encounter (Signed)
AFTER HOURS CALL    Call received 10/22/20 @ 1034pm    OB patient @ [redacted]w[redacted]d called the after hours answering service stating that she has been suffering from constipation this pregnancy. She was advised to try Miralax or Colace at her last visit. Pt states she bought Dulcolax and took two pills before she realized it was not Miralax or Colace. Pt is concerned she could have harmed her baby. Denies any VB. Admits to +FM. Pt advised Dulcolax is a category C which means there is not a lot of research/studies but the benefits can outweigh risk factors if needed. Pt informed she could switch to Miralax or Colace as directed by physician at last visit. Pt reassured and all questions answered. Advised she can follow up at [facility] L&D at any time she feels she needs evaluated.

## 2020-10-24 ENCOUNTER — Encounter
Payer: PRIVATE HEALTH INSURANCE | Attending: Obstetrics & Gynecology | Primary: Student in an Organized Health Care Education/Training Program

## 2020-10-24 ENCOUNTER — Encounter: Payer: PRIVATE HEALTH INSURANCE | Primary: Student in an Organized Health Care Education/Training Program

## 2020-10-25 ENCOUNTER — Ambulatory Visit: Attending: Family | Primary: Internal Medicine

## 2020-10-25 ENCOUNTER — Encounter
Admit: 2020-10-25 | Discharge: 2020-10-25 | Payer: PRIVATE HEALTH INSURANCE | Primary: Student in an Organized Health Care Education/Training Program

## 2020-10-25 ENCOUNTER — Ambulatory Visit
Admit: 2020-10-25 | Discharge: 2020-10-25 | Payer: PRIVATE HEALTH INSURANCE | Attending: Family | Primary: Student in an Organized Health Care Education/Training Program

## 2020-10-25 DIAGNOSIS — Z3483 Encounter for supervision of other normal pregnancy, third trimester: Secondary | ICD-10-CM

## 2020-10-25 LAB — AMB POC OB URINE DIP
Glucose (UA POC): NEGATIVE
Glucose, Urine, POC: NEGATIVE
Protein (UA POC): NEGATIVE
Protein, Urine, POC: NEGATIVE

## 2020-10-25 NOTE — Progress Notes (Signed)
Stick at 10:10

## 2020-10-25 NOTE — Progress Notes (Signed)
Doing well. Glucola today. PTL precuations. States since last night has not felt significant fetal movement. States felt movement maybe 4-5 times last night total and only a couple of times this morning. Denies bleeding, LOF, contractions. FHR 142. Work in [place] for [employer] 8/8.  AFI 21CM. Cephalic.  RTC 2 weeks.

## 2020-10-25 NOTE — Progress Notes (Signed)
BPP WORK IN

## 2020-10-25 NOTE — Progress Notes (Signed)
pass

## 2020-10-25 NOTE — Progress Notes (Signed)
Blood draw for Glucola set for 10:10am

## 2020-10-26 LAB — GLUCOSE, GESTATIONAL 1 HR TOLERANCE
Gestational Diabetes Screen: 94 mg/dL (ref 65–139)
Gestational Diabetes Screen: 94 mg/dL (ref 65–139)

## 2020-10-26 LAB — HEMOGLOBIN
HGB: 11.6 g/dL (ref 11.1–15.9)
Hemoglobin: 11.6 g/dL (ref 11.1–15.9)

## 2020-11-09 ENCOUNTER — Encounter
Payer: PRIVATE HEALTH INSURANCE | Attending: Obstetrics & Gynecology | Primary: Student in an Organized Health Care Education/Training Program

## 2020-11-12 ENCOUNTER — Ambulatory Visit: Attending: Obstetrics & Gynecology | Primary: Internal Medicine

## 2020-11-12 ENCOUNTER — Ambulatory Visit
Admit: 2020-11-12 | Discharge: 2020-11-12 | Payer: PRIVATE HEALTH INSURANCE | Attending: Obstetrics & Gynecology | Primary: Student in an Organized Health Care Education/Training Program

## 2020-11-12 DIAGNOSIS — Z3483 Encounter for supervision of other normal pregnancy, third trimester: Secondary | ICD-10-CM

## 2020-11-12 LAB — AMB POC OB URINE DIP
Glucose (UA POC): NEGATIVE
Glucose, Urine, POC: NEGATIVE
Protein (UA POC): NEGATIVE
Protein, Urine, POC: NEGATIVE

## 2020-11-27 ENCOUNTER — Encounter
Payer: PRIVATE HEALTH INSURANCE | Attending: Obstetrics & Gynecology | Primary: Student in an Organized Health Care Education/Training Program

## 2020-11-28 ENCOUNTER — Encounter
Payer: PRIVATE HEALTH INSURANCE | Attending: Obstetrics & Gynecology | Primary: Student in an Organized Health Care Education/Training Program

## 2020-11-30 ENCOUNTER — Ambulatory Visit: Attending: Obstetrics & Gynecology | Primary: Internal Medicine

## 2020-11-30 ENCOUNTER — Ambulatory Visit
Admit: 2020-11-30 | Discharge: 2020-11-30 | Payer: PRIVATE HEALTH INSURANCE | Attending: Obstetrics & Gynecology | Primary: Student in an Organized Health Care Education/Training Program

## 2020-11-30 DIAGNOSIS — Z3483 Encounter for supervision of other normal pregnancy, third trimester: Secondary | ICD-10-CM

## 2020-11-30 LAB — AMB POC OB URINE DIP
Glucose (UA POC): NEGATIVE
Glucose, Urine, POC: NEGATIVE

## 2020-11-30 NOTE — Progress Notes (Signed)
aok GBS next  Revisited due date 05/11 vs 05/16

## 2020-12-14 ENCOUNTER — Encounter
Payer: PRIVATE HEALTH INSURANCE | Attending: Obstetrics & Gynecology | Primary: Student in an Organized Health Care Education/Training Program

## 2020-12-17 ENCOUNTER — Ambulatory Visit
Admit: 2020-12-17 | Discharge: 2020-12-17 | Payer: PRIVATE HEALTH INSURANCE | Attending: Obstetrics & Gynecology | Primary: Student in an Organized Health Care Education/Training Program

## 2020-12-17 ENCOUNTER — Ambulatory Visit: Attending: Obstetrics & Gynecology | Primary: Internal Medicine

## 2020-12-17 DIAGNOSIS — Z3483 Encounter for supervision of other normal pregnancy, third trimester: Secondary | ICD-10-CM

## 2020-12-17 LAB — AMB POC OB URINE DIP
Glucose (UA POC): NEGATIVE
Glucose, Urine, POC: NEGATIVE
Protein (UA POC): NEGATIVE
Protein, Urine, POC: NEGATIVE

## 2020-12-17 NOTE — Progress Notes (Signed)
Labor precautions and kick counts.  rtc 1 week. gbs today.  Could take mom if needed.  sve very posterior / cephalic.

## 2020-12-17 NOTE — Progress Notes (Signed)
Gbs neg

## 2020-12-21 LAB — CULTURE, GENITAL GROUP B STREP
STREP GP B CULTURE: NEGATIVE
STREP GP B CULTURE: NEGATIVE

## 2020-12-24 ENCOUNTER — Encounter
Payer: PRIVATE HEALTH INSURANCE | Attending: Obstetrics & Gynecology | Primary: Student in an Organized Health Care Education/Training Program

## 2020-12-25 NOTE — Telephone Encounter (Signed)
Called patient and LVM to call and reschedule missed appointment on 5/2.

## 2020-12-26 ENCOUNTER — Ambulatory Visit: Attending: Obstetrics & Gynecology | Primary: Internal Medicine

## 2020-12-26 ENCOUNTER — Ambulatory Visit
Admit: 2020-12-26 | Discharge: 2020-12-26 | Payer: PRIVATE HEALTH INSURANCE | Attending: Obstetrics & Gynecology | Primary: Student in an Organized Health Care Education/Training Program

## 2020-12-26 DIAGNOSIS — Z1389 Encounter for screening for other disorder: Secondary | ICD-10-CM

## 2020-12-26 LAB — AMB POC OB URINE DIP
Glucose (UA POC): NEGATIVE
Glucose, Urine, POC: NEGATIVE
Protein (UA POC): NEGATIVE
Protein, Urine, POC: NEGATIVE

## 2020-12-26 NOTE — Progress Notes (Signed)
AOK.  Reassured regarding concerns  Labor precautions

## 2020-12-26 NOTE — Progress Notes (Signed)
No complaints.   Does not wanted cervix checked today

## 2021-01-01 ENCOUNTER — Ambulatory Visit: Attending: Obstetrics & Gynecology | Primary: Internal Medicine

## 2021-01-01 ENCOUNTER — Ambulatory Visit
Admit: 2021-01-01 | Discharge: 2021-01-01 | Payer: PRIVATE HEALTH INSURANCE | Attending: Obstetrics & Gynecology | Primary: Student in an Organized Health Care Education/Training Program

## 2021-01-01 DIAGNOSIS — Z3483 Encounter for supervision of other normal pregnancy, third trimester: Secondary | ICD-10-CM

## 2021-01-01 LAB — AMB POC OB URINE DIP
Glucose (UA POC): NEGATIVE
Glucose, Urine, POC: NEGATIVE
Protein (UA POC): NEGATIVE
Protein, Urine, POC: NEGATIVE

## 2021-01-01 NOTE — Progress Notes (Signed)
Thinks she might have a yeast infection or BV. Cervix check today.

## 2021-01-01 NOTE — Progress Notes (Signed)
Doing well  She request to come back this Friday for recheck of her cervix

## 2021-01-04 ENCOUNTER — Encounter
Payer: PRIVATE HEALTH INSURANCE | Attending: Obstetrics & Gynecology | Primary: Student in an Organized Health Care Education/Training Program

## 2021-01-04 NOTE — Telephone Encounter (Signed)
OB patient [redacted]w[redacted]d pregnant.  She is calling stating she has appt today, but is having contractions.  She has been having contractions since yesterday, but irregular.  She has appt at 1030.  Called patient and advised to keep appt here.  Patient states there is no way she can make it to her appt, as she had already planned on going to hospital.  Patient advised she may not be in active labor, but can go if she feels necessary.  patient v/u.

## 2021-01-06 ENCOUNTER — Inpatient Hospital Stay

## 2021-01-06 LAB — POC URINE DIPSTICK MANUAL
GLUCOSE, GLUUPC: NEGATIVE
Glucose, urine (POC): NEGATIVE
Ketones (POC): NEGATIVE
Ketones: NEGATIVE
PROTEIN, PROUPC: NEGATIVE
Protein (POC): NEGATIVE

## 2021-01-06 NOTE — Progress Notes (Signed)
Dr. Dolphus Jenny at [facility], SVE closed/thick. Discharge orders received.

## 2021-01-06 NOTE — Progress Notes (Signed)
OB ED Provider Note    Name: Sandra Lewis MRN: [id number]     Date of Birth: Oct 17, 1990  Age: 30 y.o.  Sex: female      Subjective:     Reason for Presentation to [facility] ED:  contractions/possible labor    History of Present Illness: Sandra Lewis is a 30 y.o. G3P1 at [redacted]w[redacted]d gestation with Estimated Date of Delivery: 01/07/21. Her current obstetrical history is significant for one previous full-term vaginal delivery. Presents now with contractions about every 15 minutes for several days, as well as intense pelvic pressure.    She reports good fetal movement. She denies vaginal bleeding. She denies leakage of fluid.     OB History   Gravida Para Term Preterm AB Living   3 1 1   1 1    SAB IAB Ectopic Molar Multiple Live Births             1      # Outcome Date GA Lbr Len/2nd Weight Sex Delivery Anes PTL Lv   3 Current            2 Term 09/28/14 [redacted]w[redacted]d  3.345 kg M Vag-Spont EPI  LIV   1 AB              Past Medical History:   Diagnosis Date   ??? Anxiety and depression     meds in past     No past surgical history on file.  Social History     Occupational History   ??? Not on file   Tobacco Use   ??? Smoking status: Never Smoker   ??? Smokeless tobacco: Never Used   Substance and Sexual Activity   ??? Alcohol use: Not Currently   ??? Drug use: Never   ??? Sexual activity: Yes     Partners: Male     Birth control/protection: None        No Known Allergies  Prior to Admission medications    Medication Sig Start Date End Date Taking? Authorizing Provider   cholecalciferol (VITAMIN D3) (2,000 UNITS /50 MCG) cap capsule Take  by mouth daily.    Provider, Historical   loratadine (Claritin) 10 mg tablet Take 10 mg by mouth.    Provider, Historical   prenatal vit 91/iron/folic/dha (PRENATAL + DHA PO) Take  by mouth.    Provider, Historical          Objective:     Vitals:    Visit Vitals  BP 121/75   Pulse 86   Temp 97.8 ??F (36.6 ??C)   Resp 18   LMP 03/28/2020            Wt Readings from Last 1 Encounters:   01/01/21 80.9 kg (178 lb 6.4 oz)        Physical Exam:  Patient without distress.  Abdomen: nontender    FHTs: Variability: Good {> 6 bpm), Accelerations: Reactive, Decelerations: Absent and Category I strip   Uterine Activity:  Frequency: about 4 times per hour noted on monitor  Membranes: Intact  Cervix: closed dilated, thick effacement, Floating station; Unchanged from office check 01/01/2021    Lab/Data Review:  No results found for this or any previous visit (from the past 24 hour(s)).    Assessment:  [redacted]w[redacted]d gestation  Not in labor  Obstetrical history significant for one previous full-term vaginal delivery  Reassuring fetal status      Plan:  Discharge home, follow-up at next regular OB appointment

## 2021-01-06 NOTE — Progress Notes (Signed)
Pt to room OBED 1 for triage, assessment begins, EFM and Toco applied and tracing well. Pt c/o contractions q15-20 min last 3 days. + FM per pt.

## 2021-01-06 NOTE — Progress Notes (Signed)
Discharge instructions given pt verbalizes understanding. States she will call OB's office to schedule appt tomorrow AM. Stable at DC.

## 2021-01-08 ENCOUNTER — Inpatient Hospital Stay
Admit: 2021-01-08 | Discharge: 2021-01-10 | Disposition: A | Discharge status: Home / Self Care | Payer: PRIVATE HEALTH INSURANCE | Attending: Obstetrics & Gynecology | Admitting: Obstetrics & Gynecology

## 2021-01-08 ENCOUNTER — Encounter
Payer: PRIVATE HEALTH INSURANCE | Attending: Obstetrics & Gynecology | Primary: Student in an Organized Health Care Education/Training Program

## 2021-01-08 DIAGNOSIS — O480 Post-term pregnancy: Secondary | ICD-10-CM

## 2021-01-08 LAB — CBC
Hematocrit: 37.5 % (ref 35.8–46.3)
Hemoglobin: 12.1 g/dL (ref 11.7–15.4)
MCH: 26.3 PG (ref 26.1–32.9)
MCHC: 32.3 g/dL (ref 31.4–35.0)
MCV: 81.5 FL (ref 79.6–97.8)
MPV: 12.7 FL — ABNORMAL HIGH (ref 9.4–12.3)
NRBC Absolute: 0 10*3/uL (ref 0.0–0.2)
Platelets: 220 10*3/uL (ref 150–450)
RBC: 4.6 M/uL (ref 4.05–5.2)
RDW: 14 % (ref 11.9–14.6)
WBC: 11.2 10*3/uL — ABNORMAL HIGH (ref 4.3–11.1)

## 2021-01-08 LAB — TYPE AND SCREEN
ABO/Rh: A POS
Antibody Screen: NEGATIVE

## 2021-01-08 LAB — CBC W/O DIFF
ABSOLUTE NRBC: 0 10*3/uL (ref 0.0–0.2)
HCT: 37.5 % (ref 35.8–46.3)
HGB: 12.1 g/dL (ref 11.7–15.4)
MCH: 26.3 PG (ref 26.1–32.9)
MCHC: 32.3 g/dL (ref 31.4–35.0)
MCV: 81.5 FL (ref 79.6–97.8)
MPV: 12.7 FL — ABNORMAL HIGH (ref 9.4–12.3)
PLATELET: 220 10*3/uL (ref 150–450)
RBC: 4.6 M/uL (ref 4.05–5.2)
RDW: 14 % (ref 11.9–14.6)
WBC: 11.2 10*3/uL — ABNORMAL HIGH (ref 4.3–11.1)

## 2021-01-08 LAB — TYPE & SCREEN
ABO/Rh(D): A POS
Antibody screen: NEGATIVE

## 2021-01-08 MED ORDER — OXYTOCIN 30 UNIT IN 500 ML INFUSION
30 unit/500 mL | INTRAVENOUS | Status: AC | PRN
Start: 2021-01-08 — End: 2021-01-08
  Administered 2021-01-09: via INTRAVENOUS

## 2021-01-08 MED ORDER — LACTATED RINGERS BOLUS IV
INTRAVENOUS | Status: AC | PRN
Start: 2021-01-08 — End: 2021-01-08
  Administered 2021-01-08: 18:00:00 via INTRAVENOUS

## 2021-01-08 MED ORDER — OXYTOCIN 30 UNIT IN 500 ML INFUSION
30 unit/500 mL | INTRAVENOUS | Status: DC
Start: 2021-01-08 — End: 2021-01-09

## 2021-01-08 MED ORDER — ROPIVACAINE 2 MG/ML INJECTION
2 mg/mL (0. %) | INTRAMUSCULAR | Status: DC | PRN
Start: 2021-01-08 — End: 2021-01-08
  Administered 2021-01-08: 18:00:00 via EPIDURAL

## 2021-01-08 MED ORDER — LIDOCAINE-EPINEPHRINE 1.5 %-1:200,000 IJ SOLN
1.5 %-1:200,000 | INTRAMUSCULAR | Status: AC
Start: 2021-01-08 — End: 2021-01-08
  Administered 2021-01-08: 18:00:00 via EPIDURAL

## 2021-01-08 MED ORDER — MINERAL OIL ORAL
Freq: Once | ORAL | Status: DC | PRN
Start: 2021-01-08 — End: 2021-01-08

## 2021-01-08 MED ORDER — ROPIVACAINE (PF) 5 MG/ML (0.5 %) INJECTION
5 mg/mL (0. %) | INTRAMUSCULAR | Status: DC | PRN
Start: 2021-01-08 — End: 2021-01-08
  Administered 2021-01-08: 18:00:00 via PERINEURAL

## 2021-01-08 MED ORDER — LACTATED RINGERS BOLUS IV
Freq: Once | INTRAVENOUS | Status: AC
Start: 2021-01-08 — End: 2021-01-09

## 2021-01-08 MED ORDER — BUTORPHANOL TARTRATE 2 MG/ML IJ SOLN
2 mg/mL | INTRAMUSCULAR | Status: DC | PRN
Start: 2021-01-08 — End: 2021-01-08

## 2021-01-08 MED ORDER — OXYTOCIN 0.06 UNIT/ML BOLUS FROM BAG (POST-PARTUM)
30 unit/500 mL | INTRAVENOUS | Status: AC | PRN
Start: 2021-01-08 — End: 2021-01-08
  Administered 2021-01-09: via INTRAVENOUS

## 2021-01-08 MED ORDER — LIDOCAINE HCL 1 % (10 MG/ML) IJ SOLN
10 mg/mL (1 %) | Freq: Once | INTRAMUSCULAR | Status: DC | PRN
Start: 2021-01-08 — End: 2021-01-08

## 2021-01-08 MED ORDER — SODIUM CHLORIDE 0.9 % IJ SYRG
Freq: Three times a day (TID) | INTRAMUSCULAR | Status: DC
Start: 2021-01-08 — End: 2021-01-09

## 2021-01-08 MED ORDER — DIPHTH,PERTUS(AC)TETANUS VAC(PF) 2.5 LF UNIT-8 MCG-5 LF/0.5 ML INJ
INTRAMUSCULAR | Status: DC
Start: 2021-01-08 — End: 2021-01-10

## 2021-01-08 MED ORDER — LIDOCAINE 2 % MUCOSAL GEL
2 % | Freq: Once | Status: DC | PRN
Start: 2021-01-08 — End: 2021-01-08

## 2021-01-08 MED ORDER — DEXTROSE 5%-LACTATED RINGERS IV
INTRAVENOUS | Status: DC
Start: 2021-01-08 — End: 2021-01-09
  Administered 2021-01-08: 18:00:00 via INTRAVENOUS

## 2021-01-08 MED ORDER — SODIUM CHLORIDE 0.9 % IJ SYRG
INTRAMUSCULAR | Status: DC | PRN
Start: 2021-01-08 — End: 2021-01-09

## 2021-01-08 MED FILL — MINERAL OIL ORAL: ORAL | Qty: 120

## 2021-01-08 MED FILL — OXYTOCIN 30 UNIT IN 500 ML INFUSION: 30 unit/500 mL | INTRAVENOUS | Qty: 500

## 2021-01-08 NOTE — Progress Notes (Signed)
Admission assessment complete as noted. Patient oriented to room and unit. Plan of care reviewed and patient verbalizes understanding. Questions encouraged and answered. Patent encouraged to call for needs or concerns.   Safety Teaching reviewed:   1. Hand hygiene prior to handling the infant.  2. Use of bulb syringe.  3. Bracelets with matching numbers are placed on mother and infant  4. An infant security tag  (Hugs) is placed on the infant's ankle and monitored  5. All OB nurses wear pink Employee badges - do not give your baby to anyone without proper identification.   6. Never leave the baby alone in the room.  7. The infant should be placed on their back to sleep.on a firm mattress. No toys should be placed in the crib. (safe sleep video offered to view)  8. Never shake the baby (video offered to view)  9. Infant fall prevention - do not sleep with the baby, and place the baby in the crib while ambulating.   10. Mother and Baby Care booklet given to Mother.

## 2021-01-08 NOTE — Progress Notes (Signed)
SBAR IN Report: Mother    Verbal report received from Ilda Mori, RN, (full name & credentials) on this patient, who is now being transferred from L&D (unit) for routine progression of care.  The patient is not wearing a green "Anesthesia-Duramorph" band.    Report consisted of patient's Situation, Background, Assessment and Recommendations (SBAR).       Newborn ID bands were compared with the identification form, and verified with the patient and transferring nurse.    Information from the SBAR, Kardex, Intake/Output and MAR and the [place] Report was reviewed with the transferring nurse; opportunity for questions and clarification provided.

## 2021-01-08 NOTE — Progress Notes (Signed)
Pt presents to OBED with c/o contractions occurring q 5-8 minutes that have gotten very intense since midnight. EFM and TOCO placed on soft nontender abd.

## 2021-01-08 NOTE — H&P (Signed)
OBED History & Physical    Name: Sandra Lewis MRN: [id number]  SSN: [id number]    Date of Birth: 1991/03/10  Age: 30 y.o.  Sex: female        Subjective: 30 yo G3 P1011 at [redacted]w[redacted]d presents complaining of contractions increasing in frequency and intensity.  She was seen early this am around 0300 here in the OBED and cervix was 2/60/-2  She reports contractions continuing since leaving the OBED.  Endorses active fetus.  No other concerns.     Estimated Date of Delivery: 01/07/21  OB History   Gravida Para Term Preterm AB Living   3 1 1   1 1    SAB IAB Ectopic Molar Multiple Live Births             1      # Outcome Date GA Lbr Len/2nd Weight Sex Delivery Anes PTL Lv   3 Current            2 Term 09/28/14 [redacted]w[redacted]d  3.345 kg M Vag-Spont EPI  LIV   1 AB                [name] is admitted with pregnancy at [redacted]w[redacted]d for active labor. Prenatal course was normal. Please see prenatal records for details.    Past Medical History:   Diagnosis Date   ??? Anxiety and depression     meds in past     Past Surgical History  Reviewed and negative    Social History     Occupational History   ??? Not on file   Tobacco Use   ??? Smoking status: Never Smoker   ??? Smokeless tobacco: Never Used   Substance and Sexual Activity   ??? Alcohol use: Not Currently   ??? Drug use: Never   ??? Sexual activity: Yes     Partners: Male     Birth control/protection: None     Family History   Problem Relation Age of Onset   ??? Hypertension Mother        No Known Allergies  Prior to Admission medications    Medication Sig Start Date End Date Taking? Authorizing Provider   cholecalciferol (VITAMIN D3) (2,000 UNITS /50 MCG) cap capsule Take  by mouth daily.    Provider, Historical   loratadine (Claritin) 10 mg tablet Take 10 mg by mouth.    Provider, Historical   prenatal vit 91/iron/folic/dha (PRENATAL + DHA PO) Take  by mouth.    Provider, Historical        Review of Systems: A comprehensive review of systems was negative except for that written in the HPI.    Objective:      Vitals:  There were no vitals filed for this visit.     Physical Exam:  Patient without distress.  Back: costovertebral angle tenderness absent  Abdomen: soft, nontender  Fundus: soft and non tender  Perineum: blood absent, amniotic fluid absent  Cervical Exam: 4-5/60/-2 cephalic  Lower Extremities:  - Edema No  Membranes:  Intact  Fetal Heart Rate: Reactive  Baseline: 125 beats per minute  Variability: moderate  Accelerations: yes  Decelerations: none  Uterine contractions: irregular, every 4-7 minutes    Prenatal Labs:   Lab Results   Component Value Date/Time    Rubella, External immune 06/06/2020 12:00 AM    HBsAg, External negative 06/06/2020 12:00 AM    HIV, External NR 06/06/2020 12:00 AM    RPR, External NR 06/06/2020 12:00 AM  Gonorrhea, External negative 07/02/2020 12:00 AM    Chlamydia, External negative 07/02/2020 12:00 AM    ABO,Rh A positive 06/06/2020 12:00 AM         Assessment/Plan:     Active Problems:    Uterine contractions during pregnancy (01/08/2021)      Abdominal pain during pregnancy in third trimester (01/08/2021)         Plan: Admit for Reassuring fetal status, Labor  Continue expectant management, Continue plan for vaginal delivery.  Group B Strep was negative.  FWB overall reassuring with category 1 tracing.  Reviewed assessment and plan of care with Dr. Janice Norrie who is in agreement and will assume care at this time.

## 2021-01-08 NOTE — Progress Notes (Signed)
Pt evaluated by Dr Leavy Cella for contractions. SVE 2/60/-2  EFM applied.     0320-Pt discharged ambulatory with precautions. To follow up this afternoon at 1500 as previously scheduled or return as symptoms progress. Sig other verb understanding as well.

## 2021-01-08 NOTE — Progress Notes (Signed)
SBAR received. Care assumed.

## 2021-01-08 NOTE — H&P (Signed)
CC  Uterine contractions  History:    30 y.o. female G3P1011 at [redacted]w[redacted]d weeks gestation with contractions. Have been on/off for several days. Was seen yesterday at obed triage and was closed. No lof or vb.   Her pregnancy issues include: none    Fetal movement has been normal    HISTORY:    Social History     Substance and Sexual Activity   Sexual Activity Yes   ??? Partners: Male   ??? Birth control/protection: None       OB History   Gravida Para Term Preterm AB Living   3 1 1   1 1    SAB IAB Ectopic Molar Multiple Live Births             1      # Outcome Date GA Lbr Len/2nd Weight Sex Delivery Anes PTL Lv   3 Current            2 Term 09/28/14 [redacted]w[redacted]d  3.345 kg M Vag-Spont EPI  LIV   1 AB                Social History     Socioeconomic History   ??? Marital status: SINGLE     Spouse name: Not on file   ??? Number of children: Not on file   ??? Years of education: Not on file   ??? Highest education level: Not on file   Occupational History   ??? Not on file   Tobacco Use   ??? Smoking status: Never Smoker   ??? Smokeless tobacco: Never Used   Substance and Sexual Activity   ??? Alcohol use: Not Currently   ??? Drug use: Never   ??? Sexual activity: Yes     Partners: Male     Birth control/protection: None   Other Topics Concern   ??? Not on file   Social History Narrative   ??? Not on file     Social Determinants of Health     Financial Resource Strain:    ??? Difficulty of Paying Living Expenses: Not on file   Food Insecurity:    ??? Worried About Running Out of Food in the Last Year: Not on file   ??? Ran Out of Food in the Last Year: Not on file   Transportation Needs:    ??? Lack of Transportation (Medical): Not on file   ??? Lack of Transportation (Non-Medical): Not on file   Physical Activity:    ??? Days of Exercise per Week: Not on file   ??? Minutes of Exercise per Session: Not on file   Stress:    ??? Feeling of Stress : Not on file   Social Connections:    ??? Frequency of Communication with Friends and Family: Not on file   ??? Frequency of Social  Gatherings with Friends and Family: Not on file   ??? Attends Religious Services: Not on file   ??? Active Member of Clubs or Organizations: Not on file   ??? Attends M Meetings: Not on file   ??? Marital Status: Not on file   Intimate Partner Violence:    ??? Fear of Current or Ex-Partner: Not on file   ??? Emotionally Abused: Not on file   ??? Physically Abused: Not on file   ??? Sexually Abused: Not on file   Housing Stability:    ??? Unable to Pay for Housing in the Last Year: Not on file   ??? Number  of Places Lived in the Last Year: Not on file   ??? Unstable Housing in the Last Year: Not on file       No past surgical history on file.    Past Medical History:   Diagnosis Date   ??? Anxiety and depression     meds in past         ROS:  Negative:  Negative 10 point ROS other than noted in hpi       PHYSICAL EXAM:  Blood pressure 119/81, pulse 70, temperature 97.8 ??F (36.6 ??C), resp. rate 16, last menstrual period 03/28/2020, SpO2 98 %.    General: well developed and well nourished  Resp:  breath sounds clear and equal bilaterally  Card:  RRR, no MRG  Abd: WNL.     Uterine contractions: irregular, every 4-6 minutes    Fetal Assessment: Baseline FHR: 140 per minute     Fetal heart variability: moderate     Fetal Heart Rate decelerations: none     Fetal Heart Rate accelerations:one 10x10 but just recently put on monitor     Prestentation: vertex by exam,        SVE- Cervical Exam: 2 cm dilated    60% effaced    -2 station       Ext: edema, clonus and DTR's normal      I have personally reviewed the patient's history, prenatal record, and pertinent test results.   vital sign trends, laboratory results, previous provider notes support my clinical impression.    Assessment:  [redacted]w[redacted]d weeks gestation  Reassuring fetal status  Uterine contractions, minimal cervical change.       Plan:  Monitor contractions. Once tracing reactive, will d/c to home to keep appt this am as if in labor, is in early latent phase labor.   Vickey Huger, MD    Signed By:  Vickey Huger, MD     Jan 08, 2021

## 2021-01-08 NOTE — Procedures (Signed)
Delivery Note    Patient Name: Sandra Lewis  MRN: [id number]    Obstetrician:  Abigail Miyamoto, MD    Assistant: none    Pre-Delivery Diagnosis: Term pregnancy, Spontaneous labor, Single fetus and Uncomplicated pregnancy    Post-Delivery Diagnosis: Female    Intrapartum Event: None    Procedure: Spontaneous vaginal delivery    Epidural: YES    Monitor:  Fetal Heart Tones - External and Uterine Contractions - External    Indications for instrumental delivery: none    Estimated Blood Loss: 200    Episiotomy: none    Laceration(s):  none    Presentation: Cephalic    Fetal Description: singleton    Fetal Position: Left Occiput Anterior    Birth Weight: 6-15    Birth Length: 52    Apgar - One Minute: 8    Apgar - Five Minutes: 9    Umbilical Cord: 3 vessels present and Cord blood sent to lab for type, Rh, and Coombs' test    Specimens: no           Complications:  none              Attending Attestation: I was present and scrubbed for the entire procedure

## 2021-01-08 NOTE — Progress Notes (Signed)
SBAR received from [employer]. Care assumed at this time.

## 2021-01-08 NOTE — Progress Notes (Signed)
Test dose. Pt tolerated well.

## 2021-01-08 NOTE — Progress Notes (Signed)
Epidural in place. Pt tolerated well

## 2021-01-08 NOTE — Progress Notes (Signed)
sbar to [employer], RN/ Ivar Bury, RN

## 2021-01-08 NOTE — Progress Notes (Signed)
Problem: Patient Education: Go to Patient Education Activity  Goal: Patient/Family Education  01/08/2021 2341 by Gay Filler, RN  Outcome: Progressing Towards Goal  01/08/2021 2340 by Gay Filler, RN  Outcome: Progressing Towards Goal     Problem: Vaginal Delivery: Day of Deliver-Laboring  Goal: Off Pathway (Use only if patient is Off Pathway)  01/08/2021 2341 by Gay Filler, RN  Outcome: Progressing Towards Goal  01/08/2021 2340 by Gay Filler, RN  Outcome: Progressing Towards Goal  Goal: Activity/Safety  01/08/2021 2341 by Gay Filler, RN  Outcome: Progressing Towards Goal  01/08/2021 2340 by Gay Filler, RN  Outcome: Progressing Towards Goal  Goal: Consults, if ordered  01/08/2021 2341 by Gay Filler, RN  Outcome: Progressing Towards Goal  01/08/2021 2340 by Gay Filler, RN  Outcome: Progressing Towards Goal  Goal: Diagnostic Test/Procedures  01/08/2021 2341 by Gay Filler, RN  Outcome: Progressing Towards Goal  01/08/2021 2340 by Gay Filler, RN  Outcome: Progressing Towards Goal  Goal: Nutrition/Diet  01/08/2021 2341 by Gay Filler, RN  Outcome: Progressing Towards Goal  01/08/2021 2340 by Gay Filler, RN  Outcome: Progressing Towards Goal  Goal: Discharge Planning  01/08/2021 2341 by Gay Filler, RN  Outcome: Progressing Towards Goal  01/08/2021 2340 by Gay Filler, RN  Outcome: Progressing Towards Goal  Goal: Medications  01/08/2021 2341 by Gay Filler, RN  Outcome: Progressing Towards Goal  01/08/2021 2340 by Gay Filler, RN  Outcome: Progressing Towards Goal  Goal: Respiratory  01/08/2021 2341 by Gay Filler, RN  Outcome: Progressing Towards Goal  01/08/2021 2340 by Gay Filler, RN  Outcome: Progressing Towards Goal  Goal: Treatments/Interventions/Procedures  01/08/2021 2341 by Gay Filler, RN  Outcome: Progressing Towards Goal  01/08/2021 2340 by Gay Filler, RN  Outcome: Progressing Towards Goal  Goal: *Vital signs within defined limits  01/08/2021 2341 by Gay Filler, RN  Outcome: Progressing Towards Goal  01/08/2021 2340 by Gay Filler, RN  Outcome: Progressing Towards Goal  Goal: *Labs within defined limits  01/08/2021 2341 by Gay Filler, RN  Outcome: Progressing Towards Goal  01/08/2021 2340 by Gay Filler, RN  Outcome: Progressing Towards Goal  Goal: *Hemodynamically stable  01/08/2021 2341 by Gay Filler, RN  Outcome: Progressing Towards Goal  01/08/2021 2340 by Gay Filler, RN  Outcome: Progressing Towards Goal  Goal: *Optimal pain control at patient's stated goal  01/08/2021 2341 by Gay Filler, RN  Outcome: Progressing Towards Goal  01/08/2021 2340 by Gay Filler, RN  Outcome: Progressing Towards Goal     Problem: Vaginal Delivery: Day of Delivery-Post delivery  Goal: Off Pathway (Use only if patient is Off Pathway)  01/08/2021 2341 by Gay Filler, RN  Outcome: Progressing Towards Goal  01/08/2021 2340 by Gay Filler, RN  Outcome: Progressing Towards Goal  Goal: Activity/Safety  01/08/2021 2341 by Gay Filler, RN  Outcome: Progressing Towards Goal  01/08/2021 2340 by Gay Filler, RN  Outcome: Progressing Towards Goal  Goal: Consults, if ordered  01/08/2021 2341 by Gay Filler, RN  Outcome: Progressing Towards Goal  01/08/2021 2340 by Gay Filler, RN  Outcome: Progressing Towards Goal  Goal: Nutrition/Diet  01/08/2021 2341 by Gay Filler, RN  Outcome: Progressing Towards Goal  01/08/2021 2340 by Gay Filler, RN  Outcome: Progressing Towards Goal  Goal: Discharge Planning  01/08/2021 2341 by Gay Filler, RN  Outcome: Progressing Towards Goal  01/08/2021 2340 by Gay Filler, RN  Outcome: Progressing Towards Goal  Goal: Medications  01/08/2021 2341 by Gay Filler, RN  Outcome: Progressing Towards Goal  01/08/2021 2340 by  Sorrow, Magazine features editor, RN  Outcome: Progressing Towards Goal  Goal: Treatments/Interventions/Procedures  01/08/2021 2341 by Gay Filler, RN  Outcome: Progressing Towards Goal  01/08/2021 2340 by Gay Filler,  RN  Outcome: Progressing Towards Goal  Goal: *Vital signs within defined limits  01/08/2021 2341 by Gay Filler, RN  Outcome: Progressing Towards Goal  01/08/2021 2340 by Gay Filler, RN  Outcome: Progressing Towards Goal  Goal: *Labs within defined limits  01/08/2021 2341 by Gay Filler, RN  Outcome: Progressing Towards Goal  01/08/2021 2340 by Gay Filler, RN  Outcome: Progressing Towards Goal  Goal: *Hemodynamically stable  01/08/2021 2341 by Gay Filler, RN  Outcome: Progressing Towards Goal  01/08/2021 2340 by Gay Filler, RN  Outcome: Progressing Towards Goal  Goal: *Optimal pain control at patient's stated goal  01/08/2021 2341 by Gay Filler, RN  Outcome: Progressing Towards Goal  01/08/2021 2340 by Gay Filler, RN  Outcome: Progressing Towards Goal  Goal: *Participates in infant care  01/08/2021 2341 by Gay Filler, RN  Outcome: Progressing Towards Goal  01/08/2021 2340 by Gay Filler, RN  Outcome: Progressing Towards Goal  Goal: *Demonstrates progressive activity  01/08/2021 2341 by Gay Filler, RN  Outcome: Progressing Towards Goal  01/08/2021 2340 by Gay Filler, RN  Outcome: Progressing Towards Goal  Goal: *Tolerating diet  01/08/2021 2341 by Gay Filler, RN  Outcome: Progressing Towards Goal  01/08/2021 2340 by Gay Filler, RN  Outcome: Progressing Towards Goal     Problem: Vaginal Delivery: Postpartum Day 1  Goal: Off Pathway (Use only if patient is Off Pathway)  01/08/2021 2341 by Gay Filler, RN  Outcome: Progressing Towards Goal  01/08/2021 2340 by Gay Filler, RN  Outcome: Progressing Towards Goal  Goal: Activity/Safety  01/08/2021 2341 by Gay Filler, RN  Outcome: Progressing Towards Goal  01/08/2021 2340 by Gay Filler, RN  Outcome: Progressing Towards Goal  Goal: Consults, if ordered  01/08/2021 2341 by Gay Filler, RN  Outcome: Progressing Towards Goal  01/08/2021 2340 by Gay Filler, RN  Outcome: Progressing Towards Goal  Goal: Diagnostic  Test/Procedures  01/08/2021 2341 by Gay Filler, RN  Outcome: Progressing Towards Goal  01/08/2021 2340 by Gay Filler, RN  Outcome: Progressing Towards Goal  Goal: Nutrition/Diet  01/08/2021 2341 by Gay Filler, RN  Outcome: Progressing Towards Goal  01/08/2021 2340 by Gay Filler, RN  Outcome: Progressing Towards Goal  Goal: Discharge Planning  01/08/2021 2341 by Gay Filler, RN  Outcome: Progressing Towards Goal  01/08/2021 2340 by Gay Filler, RN  Outcome: Progressing Towards Goal  Goal: Medications  01/08/2021 2341 by Gay Filler, RN  Outcome: Progressing Towards Goal  01/08/2021 2340 by Gay Filler, RN  Outcome: Progressing Towards Goal  Goal: Treatments/Interventions/Procedures  01/08/2021 2341 by Gay Filler, RN  Outcome: Progressing Towards Goal  01/08/2021 2340 by Gay Filler, RN  Outcome: Progressing Towards Goal  Goal: Psychosocial  01/08/2021 2341 by Gay Filler, RN  Outcome: Progressing Towards Goal  01/08/2021 2340 by Gay Filler, RN  Outcome: Progressing Towards Goal  Goal: *Vital signs within defined limits  01/08/2021 2341 by Gay Filler, RN  Outcome: Progressing Towards Goal  01/08/2021 2340 by Gay Filler, RN  Outcome: Progressing Towards Goal  Goal: *Labs within defined limits  01/08/2021 2341 by Gay Filler, RN  Outcome: Progressing Towards Goal  01/08/2021 2340 by Gay Filler, RN  Outcome: Progressing Towards Goal  Goal: *Hemodynamically stable  01/08/2021 2341 by Gay Filler, RN  Outcome: Progressing Towards Goal  01/08/2021 2340 by Gay Filler, RN  Outcome: Progressing Towards Goal  Goal: *Optimal pain control at patient's stated goal  01/08/2021 2341 by Gay Filler, RN  Outcome: Progressing Towards Goal  01/08/2021 2340 by Gay Filler, RN  Outcome: Progressing Towards Goal  Goal: *Participates in infant care  01/08/2021 2341 by Gay Filler, RN  Outcome: Progressing Towards Goal  01/08/2021 2340 by Gay Filler, RN  Outcome: Progressing Towards  Goal  Goal: *Demonstrates progressive activity  01/08/2021 2341 by Gay Filler, RN  Outcome: Progressing Towards Goal  01/08/2021 2340 by Gay Filler, RN  Outcome: Progressing Towards Goal  Goal: *Performs self perineal care  01/08/2021 2341 by Gay Filler, RN  Outcome: Progressing Towards Goal  01/08/2021 2340 by Gay Filler, RN  Outcome: Progressing Towards Goal  Goal: *Appropriate parent-infant bonding  01/08/2021 2341 by Gay Filler, RN  Outcome: Progressing Towards Goal  01/08/2021 2340 by Gay Filler, RN  Outcome: Progressing Towards Goal  Goal: *Tolerating diet  01/08/2021 2341 by Gay Filler, RN  Outcome: Progressing Towards Goal  01/08/2021 2340 by Gay Filler, RN  Outcome: Progressing Towards Goal  Goal: *Performs self breast care  01/08/2021 2341 by Gay Filler, RN  Outcome: Progressing Towards Goal  01/08/2021 2340 by Gay Filler, RN  Outcome: Progressing Towards Goal     Problem: Vaginal Delivery: Postpartum 2  Goal: Off Pathway (Use only if patient is Off Pathway)  01/08/2021 2341 by Gay Filler, RN  Outcome: Progressing Towards Goal  01/08/2021 2340 by Gay Filler, RN  Outcome: Progressing Towards Goal  Goal: Activity/Safety  01/08/2021 2341 by Gay Filler, RN  Outcome: Progressing Towards Goal  01/08/2021 2340 by Gay Filler, RN  Outcome: Progressing Towards Goal  Goal: Consults, if ordered  01/08/2021 2341 by Gay Filler, RN  Outcome: Progressing Towards Goal  01/08/2021 2340 by Gay Filler, RN  Outcome: Progressing Towards Goal  Goal: Nutrition/Diet  01/08/2021 2341 by Gay Filler, RN  Outcome: Progressing Towards Goal  01/08/2021 2340 by Gay Filler, RN  Outcome: Progressing Towards Goal  Goal: Discharge Planning  01/08/2021 2341 by Gay Filler, RN  Outcome: Progressing Towards Goal  01/08/2021 2340 by Gay Filler, RN  Outcome: Progressing Towards Goal  Goal: Medications  01/08/2021 2341 by Gay Filler, RN  Outcome: Progressing Towards Goal  01/08/2021 2340  by Gay Filler, RN  Outcome: Progressing Towards Goal  Goal: Treatments/Interventions/Procedures  01/08/2021 2341 by Gay Filler, RN  Outcome: Progressing Towards Goal  01/08/2021 2340 by Gay Filler, RN  Outcome: Progressing Towards Goal  Goal: Psychosocial  01/08/2021 2341 by Gay Filler, RN  Outcome: Progressing Towards Goal  01/08/2021 2340 by Gay Filler, RN  Outcome: Progressing Towards Goal     Problem: Vaginal Delivery: Discharge Outcomes  Goal: *Verbalizes name, dosage, time, side effects, and number of days to continue medications  01/08/2021 2341 by Gay Filler, RN  Outcome: Progressing Towards Goal  01/08/2021 2340 by Gay Filler, RN  Outcome: Progressing Towards Goal  Goal: *Describes available resources and support systems  01/08/2021 2341 by Gay Filler, RN  Outcome: Progressing Towards Goal  01/08/2021 2340 by Gay Filler, RN  Outcome: Progressing Towards Goal  Goal: *No signs and symptoms of infection  01/08/2021 2341 by Gay Filler, RN  Outcome: Progressing Towards Goal  01/08/2021 2340 by Gay Filler, RN  Outcome: Progressing Towards Goal  Goal: *Birth certificate information completed  01/08/2021 2341 by Gay Filler, RN  Outcome: Progressing Towards Goal  01/08/2021 2340 by Gay Filler, RN  Outcome: Progressing Towards Goal  Goal: *Received and verbalizes understanding of discharge plan and instructions  01/08/2021 2341 by Gay Filler, RN  Outcome: Progressing Towards Goal  01/08/2021 2340 by Gay Filler, RN  Outcome: Progressing Towards Goal  Goal: *Vital signs within defined limits  01/08/2021 2341 by Gay Filler, RN  Outcome: Progressing Towards Goal  01/08/2021 2340 by Gay Filler, RN  Outcome: Progressing Towards Goal  Goal: *Labs within defined limits  01/08/2021 2341 by Gay Filler, RN  Outcome: Progressing Towards Goal  01/08/2021 2340 by Gay Filler, RN  Outcome: Progressing Towards Goal  Goal: *Hemodynamically stable  01/08/2021 2341 by Gay Filler, RN  Outcome: Progressing Towards Goal  01/08/2021 2340 by Gay Filler, RN  Outcome: Progressing Towards Goal  Goal: *Optimal pain control at patient's stated goal  01/08/2021 2341 by Gay Filler, RN  Outcome: Progressing Towards Goal  01/08/2021 2340 by Gay Filler, RN  Outcome: Progressing Towards Goal  Goal: *Participates in infant care  01/08/2021 2341 by Gay Filler, RN  Outcome: Progressing Towards Goal  01/08/2021 2340 by Gay Filler, RN  Outcome: Progressing Towards Goal  Goal: *Demonstrates progressive activity  01/08/2021 2341 by Gay Filler, RN  Outcome: Progressing Towards Goal  01/08/2021 2340 by Gay Filler, RN  Outcome: Progressing Towards Goal  Goal: *Appropriate parent-infant bonding  01/08/2021 2341 by Gay Filler, RN  Outcome: Progressing Towards Goal  01/08/2021 2340 by Gay Filler, RN  Outcome: Progressing Towards Goal  Goal: *Tolerating diet  01/08/2021 2341 by Gay Filler, RN  Outcome: Progressing Towards Goal  01/08/2021 2340 by Gay Filler, RN  Outcome: Progressing Towards Goal     Problem: Lactation Care Plan  Goal: *Infant latching appropriately  01/08/2021 2341 by Gay Filler, RN  Outcome: Progressing Towards Goal  01/08/2021 2340 by Gay Filler, RN  Outcome: Progressing Towards Goal  Goal: *Weight loss less than 10% of birth weight  01/08/2021 2341 by Gay Filler, RN  Outcome: Progressing Towards Goal  01/08/2021 2340 by Gay Filler, RN  Outcome: Progressing Towards Goal     Problem: Patient Education: Go to Patient Education Activity  Goal: Patient/Family Education  01/08/2021 2341 by Gay Filler, RN  Outcome: Progressing Towards Goal  01/08/2021 2340 by Gay Filler, RN  Outcome: Progressing Towards Goal

## 2021-01-08 NOTE — Progress Notes (Signed)
efm reapplied. lr bolused of 1000 mls   Resting in a left tilt position

## 2021-01-08 NOTE — Progress Notes (Signed)
 SBAR given to Oleta Mouse RN. Care relinquished.

## 2021-01-08 NOTE — Progress Notes (Signed)
MD updated on pt status and SVE. Orders received to start pitocin. See MAR/Flowsheets for details.

## 2021-01-08 NOTE — Progress Notes (Signed)
MD orders to give pitocin. Discussed plan of care with pt and pt refused pitocin at this time. MD updated.

## 2021-01-08 NOTE — Progress Notes (Signed)
MD updated on pt SVE. No new orders received.

## 2021-01-08 NOTE — Progress Notes (Signed)
SBAR OUT Report: Mother    Verbal report given to Gay Filler, RN on this patient, who is now being transferred to MIU (unit) for routine progression of care.   The patient is not wearing a green "Anesthesia-Duramorph" band.    Report consisted of patient's Situation, Background, Assessment and Recommendations (SBAR).       Newborn ID bands were compared with the identification form, and verified with the patient and receiving nurse.    Information from the SBAR and the [facility] Report was reviewed with the receiving nurse; opportunity for questions and clarification provided.

## 2021-01-08 NOTE — Anesthesia Procedure Notes (Signed)
Epidural Block    Patient location during procedure: OB  Start time: 01/08/2021 2:03 PM  End time: 01/08/2021 2:07 PM  Reason for block: labor epidural  Staffing  Performed: attending   Anesthesiologist: Deitra Mayo, MD  Preanesthetic Checklist  Completed: patient identified, risks and benefits discussed, surgical consent, pre-op evaluation and timeout performed  Block Placement  Patient position: sitting  Prep: ChloraPrep  Sterility prep: cap, drape, gloves, hand and mask  Sedation level: no sedation  Patient monitoring: continuous pulse oximetry and heart rate  Approach: midline  Location: lumbar  Lumbar location: L3-L4  Epidural  Guidance: landmark technique  Needle  Needle type: Tuohy   Needle gauge: 17 G  Needle length: 10 cm  Needle insertion depth: 5 cm  Catheter type: end hole  Catheter size: 19 G  Catheter at skin depth: 10 cm  Catheter securement method: clear occlusive dressing, liquid medical adhesive and surgical tape  Test dose: negative (at 1419)  Medications Administered  Lidocaine-EPINEPHrine (XYLOCAINE) 1.5 %-1:200,000 Epidural, 4.5 mL  Assessment  Sensory level: T10  Block outcome: pain improved  Number of attempts: 1  Procedure assessment: patient tolerated procedure well with no immediate complications  Additional Notes  Time out at 1407

## 2021-01-09 LAB — BLOOD GAS, CORD BLOOD
BASE DEFICIT (POC): 1.9 mmol/L
Base deficit (POC): 1.9 mmol/L
FIO2 (POC): 21 %
FIO2: 21 %
HCO3 (POC): 21.8 MMOL/L — ABNORMAL LOW (ref 22–26)
HCO3, Art: 21.8 MMOL/L — ABNORMAL LOW (ref 22–26)
PCO2 Cord Blood: 34 mmHg (ref 32–68)
PH CORD BLOOD ISTAT,PHICB: 7.42 — ABNORMAL HIGH (ref 7.15–7.38)
PO2 Cord Blood: 36 mmHg
POC O2 SAT: 71.1 % — ABNORMAL LOW (ref 95–98)
pCO2 cord blood: 34 mmHg (ref 32–68)
pH, cord blood (POC): 7.42 — ABNORMAL HIGH (ref 7.15–7.38)
pO2 cord blood: 36 mmHg
sO2 (POC): 71.1 % — ABNORMAL LOW (ref 95–98)

## 2021-01-09 MED ORDER — DOCUSATE SODIUM 100 MG CAP
100 mg | Freq: Two times a day (BID) | ORAL | Status: DC
Start: 2021-01-09 — End: 2021-01-10
  Administered 2021-01-09 – 2021-01-10 (×3): via ORAL

## 2021-01-09 MED ORDER — PROMETHAZINE 25 MG TAB
25 mg | Freq: Four times a day (QID) | ORAL | Status: DC | PRN
Start: 2021-01-09 — End: 2021-01-10

## 2021-01-09 MED ORDER — OXYCODONE-ACETAMINOPHEN 7.5 MG-325 MG TAB
ORAL | Status: AC
Start: 2021-01-09 — End: 2021-01-09
  Administered 2021-01-09: 07:00:00 via ORAL

## 2021-01-09 MED ORDER — IBUPROFEN 800 MG TAB
800 mg | Freq: Four times a day (QID) | ORAL | Status: DC | PRN
Start: 2021-01-09 — End: 2021-01-10
  Administered 2021-01-09: 02:00:00 via ORAL

## 2021-01-09 MED ORDER — HYDROCODONE-ACETAMINOPHEN 5 MG-325 MG TAB
5-325 mg | ORAL | Status: DC | PRN
Start: 2021-01-09 — End: 2021-01-09
  Administered 2021-01-09: 04:00:00 via ORAL

## 2021-01-09 MED ORDER — ZOLPIDEM 5 MG TAB
5 mg | Freq: Every evening | ORAL | Status: DC | PRN
Start: 2021-01-09 — End: 2021-01-10

## 2021-01-09 MED ORDER — DIPHENHYDRAMINE 25 MG CAP
25 mg | ORAL | Status: DC | PRN
Start: 2021-01-09 — End: 2021-01-10

## 2021-01-09 MED ORDER — OXYCODONE-ACETAMINOPHEN 7.5 MG-325 MG TAB
ORAL | Status: DC | PRN
Start: 2021-01-09 — End: 2021-01-10
  Administered 2021-01-09 – 2021-01-10 (×5): via ORAL

## 2021-01-09 MED ORDER — SIMETHICONE 80 MG CHEWABLE TAB
80 mg | Freq: Four times a day (QID) | ORAL | Status: DC | PRN
Start: 2021-01-09 — End: 2021-01-10

## 2021-01-09 MED ORDER — NALOXONE 0.4 MG/ML INJECTION
0.4 mg/mL | INTRAMUSCULAR | Status: DC | PRN
Start: 2021-01-09 — End: 2021-01-10

## 2021-01-09 MED FILL — IBUPROFEN 800 MG TAB: 800 mg | ORAL | Qty: 1

## 2021-01-09 MED FILL — DOCUSATE SODIUM 100 MG CAP: 100 mg | ORAL | Qty: 1

## 2021-01-09 MED FILL — OXYCODONE-ACETAMINOPHEN 7.5 MG-325 MG TAB: ORAL | Qty: 1

## 2021-01-09 MED FILL — HYDROCODONE-ACETAMINOPHEN 5 MG-325 MG TAB: 5-325 mg | ORAL | Qty: 1

## 2021-01-09 NOTE — Progress Notes (Signed)
Telephone orders received from Dr. Janice Norrie for percocet 7.5 q4 prn.

## 2021-01-09 NOTE — Consults (Signed)
This note was copied from a baby's chart.  First visit with breastfeeding mom.  Discussed feeding baby on cue.  Opening mouth, turning head from side to side, bringing hands to mouth and sucking movements are all early hunger cues.  Crying is a late hunger cue.  Do lots of skin to skin to help recognize early feeding cues.  If baby does not nurse, continue skin to skin and offer again later.  On average newborns eat at least 8 or more times per day without restriction. Cluster feeding is normal.  Reviewed positioning techniques and signs of a good latch.  Discussed holding baby so that ear, shoulder and hip are in a straight line.  Baby is held close and nose lines up with mom's nipple.  Discussed supporting baby's neck and mom's breast.  When baby opens wide bring baby quickly onto the breast.  Try for an assymetrical latch with nipple pointed towards the roof of baby's mouth.  Mouth should be wide and lips flanged around the breast.  Encouraged to nurse on the first breast until baby finishes that side.  If baby comes off the breast quickly, you can re-offer that same side to ensure good stimulation before moving baby to next side.  Offer the second breast, baby can take the second breast as long as desired.  It is ok if they do not take the second side when offered.  Listen and look for swallows.  Once milk is in over the next few days, swallowing will become more frequent and obvious.  If baby pausing on the breast longer than 10 seconds, remind baby to nurse.  Attempt to burp between breasts and after feeding.  Rotate which breast the baby starts on.  Discussed expected output based on age.  Discussed feed on demand.  If necessary rouse for feedings at least until above birth weight.  Reviewed Breastfeeding Packet and encouraged mom to write down feedings and output at least until first Ped visit.  In accordance with the 2012 AAP Policy Statement on Breastfeeding, Mothers of healthy term breastfed infants  should be delay pacifier use until breastfeeding is well-established, usually about 3 to 4 wk after birth.  Pacifiers are not available on the Mother Infant Unit.  Artificial nipples should also be avoided until breastfeeding is well established.

## 2021-01-09 NOTE — Progress Notes (Signed)
Chart reviewed - depression/anxiety.    Patient denies any history of postpartum depression/anxiety or generalized depression/anxiety.    Patient without a PCP.  Referral made to [facility] PCP Coordinator.    Patient given informational packet on perinatal mood & anxiety disorders (resources/education).    Family denies any additional needs from social worker at this time.  Family has social worker's contact information should any needs/questions arise.    Leta Speller, LISW-CP, PMH-C  [place]   [phone]

## 2021-01-09 NOTE — Progress Notes (Signed)
Patient assessment completed as noted.

## 2021-01-09 NOTE — Consults (Signed)
This note was copied from a baby's chart.  Lactation visit. 2nd baby but mom only tried to breastfeed briefly with first. This baby has been latching well since delivery per mom. Fed almost 60 minutes at last feeding. Due to feed now. Good suck on assessment. Roused easily, reviewed waking measures as needed. Assisted on both breasts in football hold. Everted longer nipples. Baby eager and opens mouth well. Latches well to both breasts, reviewed manual lip flange. Stays on well. Feeding very well. Did 15 minutes on left breast and then assisted onto right breast, feeding well now on right breast. Offer both breasts at all feeds. Mom easily able to hand express colostrum. Reviewed feeding expectations. Feed on demand, at least every 2-3 hours. Wake as needed. Doing very well with latching. Questions answered.

## 2021-01-09 NOTE — Progress Notes (Signed)
Shift assessment complete as noted. Patient resting comfortably. Questions encouraged and answered. Encouraged to call for needs or concerns. Verbalizes understanding.

## 2021-01-09 NOTE — Anesthesia Post-Procedure Evaluation (Signed)
Anesthesia Post Evaluation      Multimodal analgesia: multimodal analgesia used between 6 hours prior to anesthesia start to PACU discharge  Patient location during evaluation: PACU  Patient participation: complete - patient participated  Level of consciousness: awake and alert  Pain management: adequate  Airway patency: patent  Anesthetic complications: no  Cardiovascular status: acceptable  Respiratory status: acceptable  Hydration status: acceptable  Post anesthesia nausea and vomiting:  none  Final Post Anesthesia Temperature Assessment:  Normothermia (36.0-37.5 degrees C)      INITIAL Post-op Vital signs: No vitals data found for the desired time range.

## 2021-01-09 NOTE — Progress Notes (Signed)
Bedside report received from Sumner Sorrow, RN. Patient care assumed.

## 2021-01-09 NOTE — Anesthesia Pre-Procedure Evaluation (Signed)
Relevant Problems   No relevant active problems       Anesthetic History               Review of Systems / Medical History  Patient summary reviewed and pertinent labs reviewed    Pulmonary  Within defined limits                 Neuro/Psych         Psychiatric history (Anxiety/depression)     Cardiovascular                  Exercise tolerance: >4 METS     GI/Hepatic/Renal  Within defined limits              Endo/Other  Within defined limits           Other Findings              Physical Exam    Airway  Mallampati: II  TM Distance: 4 - 6 cm  Neck ROM: normal range of motion   Mouth opening: Normal     Cardiovascular    Rhythm: regular  Rate: normal         Dental  No notable dental hx       Pulmonary  Breath sounds clear to auscultation               Abdominal  GI exam deferred       Other Findings            Anesthetic Plan    ASA: 2  Anesthesia type: epidural            Anesthetic plan and risks discussed with: Patient and Spouse      Discussed anesthesia options and risks for labor analgesia and patient wishes to proceed with CLE

## 2021-01-09 NOTE — Progress Notes (Signed)
Post-Delivery Day Number 1/2 Progress Note    Patient doing well post-delivery day 1/2 from vaginal delivery without significant complaints.  Pain controlled on current medication.Tolerating diet. Ambulating/Voiding without difficulty, normal lochia.    Vitals:  Blood pressure 105/66, pulse 66, temperature 97.6 ??F (36.4 ??C), resp. rate 16, height 5\' 4"  (1.626 m), weight 178 lb (80.7 kg), last menstrual period 03/28/2020, SpO2 98 %, currently breastfeeding.    Vital signs stable, afebrile.    Exam:  Patient without distress.               Abdomen soft, fundus firm at level of umbilicus, nontender. Lower extremities are negative for swelling, cords or tenderness. Lochia- moderate    Labs: No lab exists for component: HBG    Assessment and Plan:  Patient appears to be having uncomplicated post-vaginal delivery course.  Continue routine post-op care and maternal education.  Expect d/c home am

## 2021-01-10 MED ORDER — IBUPROFEN 800 MG TAB
800 mg | ORAL_TABLET | Freq: Three times a day (TID) | ORAL | 0 refills | Status: AC | PRN
Start: 2021-01-10 — End: ?

## 2021-01-10 MED ORDER — ACETAMINOPHEN 500 MG TAB
500 mg | ORAL_TABLET | Freq: Four times a day (QID) | ORAL | 0 refills | Status: AC | PRN
Start: 2021-01-10 — End: ?

## 2021-01-10 MED ORDER — OXYCODONE 5 MG TAB
5 mg | ORAL_TABLET | Freq: Four times a day (QID) | ORAL | 0 refills | Status: AC | PRN
Start: 2021-01-10 — End: 2021-01-13

## 2021-01-10 MED FILL — DOCUSATE SODIUM 100 MG CAP: 100 mg | ORAL | Qty: 1

## 2021-01-10 MED FILL — OXYCODONE-ACETAMINOPHEN 7.5 MG-325 MG TAB: ORAL | Qty: 1

## 2021-01-10 NOTE — Progress Notes (Signed)
Report from M. Herr

## 2021-01-10 NOTE — Consults (Signed)
This note was copied from a baby's chart.  Mom and baby are going home today.  Continue to offer the breast without restriction.  Mom's milk should be fully in over the next few days.  Reviewed engorgement precautions.  Hand Expression has been demoed and written hand-out reviewed.  As milk comes in baby will be more alert at the breast and swallows will be more obvious.  Breasts may feel softer once baby has finished nursing.  Baby should be back to birth weight by 2 weeks of age.  And then gain on average 1 oz per day for the next 2-3 months.  Reviewed babies should be exclusively breastfeeding for the first 6 months and that breastfeeding should continue after introduction of appropriate complimentary foods after 6 months.  Initial output should be at least 1 wet and 1 bowel movement for each day old baby is.  By day 5-7 once milk is fully in baby will consistently have 6 or more soaking wet diapers and about 4 bowel movement.  Some babies have a bowel movement with every feeding and some have 1-3 large bowel movements each day.  Inadequate output may indicate inadequate feedings and should be reported to your Pediatrician.  Bowel habits may change as baby gets older.  Encouraged follow-up at Pediatrician in 1-2 days, no later than 1 week of age.  Call [facility] for any questions as needed or to set up an OP visit.  OP phone calls are returned within 24 hours. Community Breastfeeding Resource List given.

## 2021-01-10 NOTE — Progress Notes (Signed)
Patient discharged home via wheelchair.  

## 2021-01-10 NOTE — Progress Notes (Signed)
Discharge instructions completed.  Patient voiced understanding.  Prescriptions sent electronically.  Waiting on transportation

## 2021-01-10 NOTE — Progress Notes (Signed)
Patient sleeping

## 2021-01-10 NOTE — Progress Notes (Signed)
Post-Partum Day Number 2 Progress Note    Patient doing well  without significant complaints.  Pain controlled on current medication.  Voiding without difficulty, normal lochia.    Vitals:    Visit Vitals  BP 109/70 (BP 1 Location: Left arm, BP Patient Position: At rest)   Pulse 78   Temp 98 ??F (36.7 ??C)   Resp 16   Ht 5\' 4"  (1.626 m)   Wt 178 lb (80.7 kg)   LMP 03/28/2020   SpO2 94%   Breastfeeding Yes   BMI 30.55 kg/m??       Vital signs stable, afebrile.    Exam:  Patient without distress.  Heart rrr  Lungs cta b&s               Abdomen soft, fundus firm at level of umbilicus, nontender.                 Lower extremities are negative for swelling, cords or tenderness.     Lab Results   Component Value Date/Time    ABO Group A 06/06/2020 10:05 AM    Rh (D) Positive 06/06/2020 10:05 AM    ABO/Rh(D) A POSITIVE 01/08/2021 01:34 PM        Lab Results   Component Value Date/Time    ABO/Rh(D) A POSITIVE 01/08/2021 01:34 PM    Antibody screen NEG 01/08/2021 01:34 PM    Antibody screen, External negative 06/06/2020 12:00 AM    Rubella, External immune 06/06/2020 12:00 AM    ABO,Rh A positive 06/06/2020 12:00 AM     Lab Results   Component Value Date/Time    HGB 12.1 01/08/2021 01:03 PM    Hgb, External 13.0 06/06/2020 12:00 AM         Assessment and Plan:  Patient appears to be having uncomplicated post-partum course.  Continue routine post-delivery care and maternal education.   Breastfeeding.  Will discharge home.

## 2021-01-10 NOTE — Discharge Summary (Signed)
Obstetrical Discharge Summary     Name: Sandra Lewis MRN: [id number]  SSN: [id number]    Date of Birth: Apr 07, 1991  Age: 30 y.o.  Sex: female      Allergies: Patient has no known allergies.    Admit Date: 2021-02-05    Discharge Date: 01/10/2021     Admitting Physician: Juline Patch, MD     Attending Physician:  Abigail Miyamoto, MD     * Admission Diagnoses: Abdominal pain during pregnancy in third trimester [O26.893, R10.9]    * Discharge Diagnoses:   Information for the patient's newborn:  Sandra, Lewis [[id number]]   Delivery of a 6 lb 15.5 oz (3.16 kg) female infant via Vaginal, Spontaneous on Feb 05, 2021 at 7:54 PM  by Rennie Natter [place]. Apgars were 9  and 9 .       Additional Diagnoses:   Hospital Problems as of 01/10/2021 Date Reviewed: 2021-02-05          Codes Class Noted - Resolved POA    * (Principal) Uterine contractions during pregnancy ICD-10-CM: O47.9  ICD-9-CM: 644.10  02-05-2021 - Present Yes        Abdominal pain during pregnancy in third trimester ICD-10-CM: O26.893, R10.9  ICD-9-CM: 646.83, 789.00  Feb 05, 2021 - Present Unknown             Lab Results   Component Value Date/Time    ABO/Rh(D) A POSITIVE 05-Feb-2021 01:34 PM    Rubella, External immune 06/06/2020 12:00 AM    ABO,Rh A positive 06/06/2020 12:00 AM    There is no immunization history for the selected administration types on file for this patient.    * Procedures:vaginal delivery          * Discharge Condition: good    * Hospital Course: Normal hospital course following the delivery.    * Disposition: Home    Discharge Medications:   Current Discharge Medication List      START taking these medications    Details   ibuprofen (MOTRIN) 800 mg tablet Take 1 Tablet by mouth every eight (8) hours as needed for Pain.  Qty: 20 Tablet, Refills: 0  Start date: 01/10/2021      oxyCODONE IR (Roxicodone) 5 mg immediate release tablet Take 1 Tablet by mouth every six (6) hours as needed for Pain for up to 3 days. Max Daily Amount: 20 mg.  Qty: 20  Tablet, Refills: 0  Start date: 01/10/2021, End date: 01/13/2021    Associated Diagnoses: [redacted] weeks gestation of pregnancy      acetaminophen (Tylenol Extra Strength) 500 mg tablet Take 2 Tablets by mouth every six (6) hours as needed for Pain.  Qty: 30 Tablet, Refills: 0  Start date: 01/10/2021         CONTINUE these medications which have NOT CHANGED    Details   loratadine (Claritin) 10 mg tablet Take 10 mg by mouth.      prenatal vit 91/iron/folic/dha (PRENATAL + DHA PO) Take  by mouth.      cholecalciferol (VITAMIN D3) (2,000 UNITS /50 MCG) cap capsule Take  by mouth daily.             * Follow-up Care/Patient Instructions:  Activity: No sex for 6 weeks  Diet: Regular Diet  Wound Care: As directed    Follow-up Information     Follow up With Specialties Details Why Contact Info    Abigail Miyamoto, MD Obstetrics & Gynecology, Gynecology, Obstetrics Schedule an appointment as soon  as possible for a visit in 2 weeks  [address]  [address]  [place] [address]  [phone]      Bshsi, Not On File, MD Internal Medicine Physician   [address] [employer]  [address]  [place] [address]  [phone]

## 2021-01-10 NOTE — Progress Notes (Signed)
Problem: Vaginal Delivery: Day of Deliver-Laboring  Goal: Off Pathway (Use only if patient is Off Pathway)  Outcome: Resolved/Met  Goal: Activity/Safety  Outcome: Resolved/Met  Goal: Consults, if ordered  Outcome: Resolved/Met  Goal: Diagnostic Test/Procedures  Outcome: Resolved/Met  Goal: Nutrition/Diet  Outcome: Resolved/Met  Goal: Discharge Planning  Outcome: Resolved/Met  Goal: Medications  Outcome: Resolved/Met  Goal: Respiratory  Outcome: Resolved/Met  Goal: Treatments/Interventions/Procedures  Outcome: Resolved/Met  Goal: *Vital signs within defined limits  Outcome: Resolved/Met  Goal: *Labs within defined limits  Outcome: Resolved/Met  Goal: *Hemodynamically stable  Outcome: Resolved/Met  Goal: *Optimal pain control at patient's stated goal  Outcome: Resolved/Met     Problem: Vaginal Delivery: Day of Delivery-Post delivery  Goal: Off Pathway (Use only if patient is Off Pathway)  Outcome: Resolved/Met  Goal: Activity/Safety  Outcome: Resolved/Met  Goal: Consults, if ordered  Outcome: Resolved/Met  Goal: Nutrition/Diet  Outcome: Resolved/Met  Goal: Discharge Planning  Outcome: Resolved/Met  Goal: Medications  Outcome: Resolved/Met  Goal: Treatments/Interventions/Procedures  Outcome: Resolved/Met  Goal: *Vital signs within defined limits  Outcome: Resolved/Met  Goal: *Labs within defined limits  Outcome: Resolved/Met  Goal: *Hemodynamically stable  Outcome: Resolved/Met  Goal: *Optimal pain control at patient's stated goal  Outcome: Resolved/Met  Goal: *Participates in infant care  Outcome: Resolved/Met  Goal: *Demonstrates progressive activity  Outcome: Resolved/Met  Goal: *Tolerating diet  Outcome: Resolved/Met     Problem: Vaginal Delivery: Postpartum Day 1  Goal: Off Pathway (Use only if patient is Off Pathway)  Outcome: Resolved/Met  Goal: Activity/Safety  Outcome: Resolved/Met  Goal: Consults, if ordered  Outcome: Resolved/Met  Goal: Diagnostic Test/Procedures  Outcome: Resolved/Met  Goal:  Nutrition/Diet  Outcome: Resolved/Met  Goal: Discharge Planning  Outcome: Resolved/Met  Goal: Medications  Outcome: Resolved/Met  Goal: Treatments/Interventions/Procedures  Outcome: Resolved/Met  Goal: Psychosocial  Outcome: Resolved/Met  Goal: *Vital signs within defined limits  Outcome: Resolved/Met  Goal: *Labs within defined limits  Outcome: Resolved/Met  Goal: *Hemodynamically stable  Outcome: Resolved/Met  Goal: *Optimal pain control at patient's stated goal  Outcome: Resolved/Met  Goal: *Participates in infant care  Outcome: Resolved/Met  Goal: *Demonstrates progressive activity  Outcome: Resolved/Met  Goal: *Performs self perineal care  Outcome: Resolved/Met  Goal: *Appropriate parent-infant bonding  Outcome: Resolved/Met  Goal: *Tolerating diet  Outcome: Resolved/Met  Goal: *Performs self breast care  Outcome: Resolved/Met

## 2021-01-10 NOTE — Consults (Signed)
This note was copied from a baby's chart.  Observed at breast in football on L.  Baby fed fairly well, some on and off.  Demonstrated manual lip flange.  Encouraged frequent feeding and watch output.  Discussed insurance pumping if desired once home.

## 2021-01-16 ENCOUNTER — Telehealth
Admit: 2021-01-16 | Discharge: 2021-01-16 | Payer: PRIVATE HEALTH INSURANCE | Attending: Internal Medicine | Primary: Internal Medicine

## 2021-01-16 DIAGNOSIS — J302 Other seasonal allergic rhinitis: Secondary | ICD-10-CM

## 2021-01-16 NOTE — Progress Notes (Signed)
FOLLOW UP VISIT    Subjective:    Sandra Lewis (DOB: 02-Apr-1991) is a 30 y.o., female,   Chief Complaint   Patient presents with   ??? New Patient     ESTABLISH    ??? Mass     UNDER ARM        HPI:  30 year old recently gave birth noticed a mass in her armpit .  She states it is her right armpit.  She is breastfeeding.  She states the mass is not bigger and it is not painful .   She is not having any night sweats or weight loss.  She states it just started a few days ago.  She has an apt with her GYN next week.  She has a hx of seasonal allergies.          The following portions of the patient's history were reviewed and updated as appropriate:      Past Medical History:   Diagnosis Date   ??? Anxiety and depression     meds in past       History reviewed. No pertinent surgical history.    Family History   Problem Relation Age of Onset   ??? Hypertension Mother        Social History     Socioeconomic History   ??? Marital status: Single     Spouse name: Not on file   ??? Number of children: Not on file   ??? Years of education: Not on file   ??? Highest education level: Not on file   Occupational History   ??? Not on file   Tobacco Use   ??? Smoking status: Never Smoker   ??? Smokeless tobacco: Never Used   Substance and Sexual Activity   ??? Alcohol use: Not Currently   ??? Drug use: Never   ??? Sexual activity: Not on file   Other Topics Concern   ??? Not on file   Social History Narrative   ??? Not on file     Social Determinants of Health     Financial Resource Strain:    ??? Difficulty of Paying Living Expenses: Not on file   Food Insecurity:    ??? Worried About Running Out of Food in the Last Year: Not on file   ??? Ran Out of Food in the Last Year: Not on file   Transportation Needs:    ??? Lack of Transportation (Medical): Not on file   ??? Lack of Transportation (Non-Medical): Not on file   Physical Activity:    ??? Days of Exercise per Week: Not on file   ??? Minutes of Exercise per Session: Not on file   Stress:    ??? Feeling of Stress : Not on  file   Social Connections:    ??? Frequency of Communication with Friends and Family: Not on file   ??? Frequency of Social Gatherings with Friends and Family: Not on file   ??? Attends Religious Services: Not on file   ??? Active Member of Clubs or Organizations: Not on file   ??? Attends Banker Meetings: Not on file   ??? Marital Status: Not on file   Intimate Partner Violence:    ??? Fear of Current or Ex-Partner: Not on file   ??? Emotionally Abused: Not on file   ??? Physically Abused: Not on file   ??? Sexually Abused: Not on file   Housing Stability:    ??? Unable to  Pay for Housing in the Last Year: Not on file   ??? Number of Places Lived in the Last Year: Not on file   ??? Unstable Housing in the Last Year: Not on file       Current Outpatient Medications   Medication Sig Dispense Refill   ??? Cholecalciferol 50 MCG (2000 UT) CAPS Take by mouth daily     ??? loratadine (CLARITIN) 10 MG tablet Take 10 mg by mouth       No current facility-administered medications for this visit.       Allergies as of 01/16/2021   ??? (No Known Allergies)       Review of Systems    Objective:    Height 5\' 5"  (1.651 m), weight 150 lb (68 kg).    Physical Exam    No results found for this visit on 01/16/21.    Assessent & Plan      ICD-10-CM    1. Seasonal allergies  J30.2    2. Axillary mass, right  R22.31 01/18/21 BREAST COMPLETE RIGHT   3. Encounter to establish care  Z76.89      DO WARM COMPRESS  AND IS GOING TO GYN NEXT WEEK- IF EXAM ABNORMAL GET ULTRASOUND SUSPECT CLOGGED MILK DUCT  FOR ALLERGIES C/W CLARITIN  FOR ROUTINE WORK SET UP FOR A CPE AND GET OLD RECORDS  REVIEWED RECORDS IN CHART   Sandra ALFAROwas evaluated through a synchronous (real-time) audio-video encounter, and/or her healthcare decision maker, is aware that it is a billable service, which includes applicable co-pays, with coverage as determined by her insurance carrier. She provided verbal consent to proceed and patient identification was verified. This visit was conducted  pursuant to the emergency declaration under the [place] and the [employer] 1135 waiver authority and the [employer] and [employer] Act. A caregiver was present when appropriate. Ability to conduct physical exam was limited. The patient was located at home in a state where the provider was licensed to provide care.   The patient and/or patient representative voiced understanding and agreement with the current diagnoses, recommendations, and possible side effects.    No follow-up provider specified.      [facility], MD

## 2021-01-29 ENCOUNTER — Other Ambulatory Visit
Admit: 2021-01-29 | Discharge: 2021-01-29 | Payer: PRIVATE HEALTH INSURANCE | Attending: Women's Health | Primary: Internal Medicine

## 2021-01-29 MED ORDER — IBUPROFEN 800 MG PO TABS
800 | ORAL_TABLET | Freq: Three times a day (TID) | ORAL | 0 refills | Status: DC | PRN
Start: 2021-01-29 — End: 2022-05-28

## 2021-01-29 NOTE — Progress Notes (Signed)
Patient presents today for 2 week postpartum visit.   She delivered by Dr. Janice Norrie on 01/08/21 by SVD without lacerations.  Baby is a female named "A'lani" weighing 6 lbs 15.5 oz.   She is breastfeeding. Does not want contraception.   Last pap smear: 07/02/20 Negative, chlamydia/gonorrhea/trichomonas negative.     Name: Sandra Lewis    Date: 01/29/2021    Age: 30 y.o.    OB History     Gravida   3    Para   2    Term   2    Preterm        AB   1    Living   2       SAB        IAB        Ectopic        Molar        Multiple        Live Births   2              BP 102/68    Ht 5\' 5"  (1.651 m)    Wt 165 lb (74.8 kg)    LMP  (LMP Unknown)        Current Outpatient Medications   Medication Sig Dispense Refill   ??? Cholecalciferol 50 MCG (2000 UT) CAPS Take by mouth daily (Patient not taking: Reported on 01/29/2021)     ??? loratadine (CLARITIN) 10 MG tablet Take 10 mg by mouth (Patient not taking: Reported on 01/29/2021)       No current facility-administered medications for this visit.       Urine:@LASTPROCAMB 03/31/2021    Physical Exam  Physical Exam  Constitutional:       Appearance: Normal appearance.   Neurological:      Mental Status: She is alert and oriented to person, place, and time.   Psychiatric:         Mood and Affect: Mood normal.         Behavior: Behavior normal.   Vitals reviewed.        Moods stable  Lochia appropriate  No lacerations declines exam.   Declines BC  Breastfeeding going well with no discomfort.  C/o some mild back pain from where epidural was placed. Would like ibu rx for prn use. Advised on use of ibu prn.    rtc 4 wks for 6wk pp    No orders of the defined types were placed in this encounter.    No follow-up provider specified.    Supervising physician is Dr. ([id number])@.   Greater than 50% of this 20 minute visit is spent in counseling to the above topics.    Leron Croak, APRN - CNP

## 2021-02-26 ENCOUNTER — Encounter: Payer: MEDICAID | Attending: Obstetrics & Gynecology | Primary: Internal Medicine

## 2021-06-28 ENCOUNTER — Ambulatory Visit
Admit: 2021-06-28 | Discharge: 2021-06-28 | Payer: PRIVATE HEALTH INSURANCE | Attending: Women's Health | Primary: Internal Medicine

## 2021-06-28 DIAGNOSIS — K6289 Other specified diseases of anus and rectum: Secondary | ICD-10-CM

## 2021-06-28 LAB — AMB POC SMEAR, STAIN & INTERPRET, WET MOUNT SC
Bacteria, Wet Mount POC: NEGATIVE
RBC Wet Mount, POC: NEGATIVE
Trich, Wet Mount POC: ABSENT
WBC, Wet Mount POC: NEGATIVE
Yeast, Wet Mount POC: NONE SEEN

## 2021-06-28 MED ORDER — TINIDAZOLE 500 MG PO TABS
500 | ORAL_TABLET | Freq: Every day | ORAL | 0 refills | Status: DC
Start: 2021-06-28 — End: 2021-09-03

## 2021-06-28 NOTE — Progress Notes (Signed)
Sandra Lewis is a 30 y.o. year-old [id number] with complaints of increased vaginal discharge that she reports is grey and malodorous  x 3d. Denies vaginal itching/burning.     Pt states main complaint is vaginal odor.     Reports she was told she had a skin tag on her rectum about 94yr ago which has become painful in the last 6-57mos. Has tried tea tree oil w/ no relief. Denies hemorrhoid.    HISTORY:    [id number]  Patient's last menstrual period was 06/19/2021 (exact date).  Sexual History:  single partner, contraception - none  Contraception:  none  Current Outpatient Medications on File Prior to Visit   Medication Sig Dispense Refill    ibuprofen (ADVIL;MOTRIN) 800 MG tablet Take 1 tablet by mouth every 8 hours as needed for Pain 30 tablet 0     No current facility-administered medications on file prior to visit.       ROS:  Feeling well. No dyspnea or chest pain on exertion.  No abdominal pain, change in bowel habits, black or bloody stools.  No urinary tract symptoms. GYN ROS: she complains of vaginal odor.    PHYSICAL EXAM:  Weight 159 lb 6.4 oz (72.3 kg), last menstrual period 06/19/2021, currently breastfeeding.    The patient appears well, alert, oriented x 3, in no distress.  Pelvic: VULVA: normal appearing vulva with no masses, tenderness or lesions, VAGINA: normal appearing vagina with normal color and discharge, no lesions, vaginal discharge - white, malodorous, and milky, CERVIX: normal appearing cervix without discharge or lesions, RECTAL: normal rectal, no masses. 12 oclock small area of redundant tissue, does not appear to be a hemorrhoid, no irritation or redness, no lesions currently.     ASSESSMENT:  Encounter Diagnoses   Name Primary?    Rectal discomfort Yes    Acute vaginitis     Bacterial vaginosis        PLAN:  All questions answered  Diagnosis explained in detail, including differential  +BV on wet prep. Will tx with tindamax.  Check nuswab vag +  Reassured no significant anal findings, can simply try  preparation H to see if helps with irritation and discomfort.   She would still like referral to colorectal specialist---will place per pt request.  Avoid tea tree oil to anus as this can be irritating    Orders Placed This Encounter   Procedures    Nuswab Vaginitis Plus (VG+)     Standing Status:   Future     Number of Occurrences:   1     Standing Expiration Date:   06/28/2022    External Referral To Colorectal Surgery     Referral Priority:   Routine     Referral Type:   Eval and Treat     Referral Reason:   Specialty Services Required     Requested Specialty:   Colon and Rectal Surgery     Number of Visits Requested:   1    AMB POC SMEAR, STAIN & INTERPRET, WET MOUNT [facility]         Supervising physician is Dr. Leron Croak.  30 min chart review, exam, counseling and documentation

## 2021-07-02 LAB — NUSWAB VAGINITIS PLUS (VG+)
Candida albicans, NAA: NEGATIVE
Candida glabrata: NEGATIVE
Chlamydia trachomatis, NAA: NEGATIVE
Neisseria Gonorrhoeae, NAA: NEGATIVE
Trichomonas Vaginalis by NAA: NEGATIVE

## 2021-09-02 ENCOUNTER — Encounter: Payer: PRIVATE HEALTH INSURANCE | Attending: Women's Health | Primary: Internal Medicine

## 2021-09-02 NOTE — Progress Notes (Deleted)
The patient is a 31 y.o. [id number] who is seen for a yeast infection.     [id number]  No LMP recorded.  Sexual History:  {Sexual Hx:5163}  Contraception:  {Contraception Methods:5051}  Current Outpatient Medications on File Prior to Visit   Medication Sig Dispense Refill    tinidazole (TINDAMAX) 500 MG tablet Take 2 tablets by mouth daily (with breakfast) 10 tablet 0    ibuprofen (ADVIL;MOTRIN) 800 MG tablet Take 1 tablet by mouth every 8 hours as needed for Pain 30 tablet 0     No current facility-administered medications on file prior to visit.       ROS:  Feeling well. No dyspnea or chest pain on exertion.  No abdominal pain, change in bowel habits, black or bloody stools.  No urinary tract symptoms. GYN ROS: {gyn ros:315267}.    PHYSICAL EXAM:  currently breastfeeding.    The patient appears well, alert, oriented x 3, in no distress.  Lungs are clear. Heart RRR, no murmurs. Abdomen soft without tenderness, guarding, mass or organomegaly.  Pelvic: {pelvic exam:315900::"normal external genitalia, vulva, vagina, cervix, uterus and adnexa"}.    ASSESSMENT:  No diagnosis found.    PLAN:  {Gyn plan:13146}  No orders of the defined types were placed in this encounter.        Supervising physician is Dr. Marland Kitchen  Greater than 50% of the *** minute visit were spent in counseling to the above topics.

## 2021-09-03 ENCOUNTER — Ambulatory Visit
Admit: 2021-09-03 | Discharge: 2021-09-03 | Payer: PRIVATE HEALTH INSURANCE | Attending: Women's Health | Primary: Internal Medicine

## 2021-09-03 DIAGNOSIS — N898 Other specified noninflammatory disorders of vagina: Secondary | ICD-10-CM

## 2021-09-03 NOTE — Progress Notes (Signed)
The patient is a 31 y.o. [id number] who is seen for  yeast infection sx.   Pt was treated for BV on 06/28/21 w/ Tindamax.   Pt recently had intercourse and ever since then has just felt like things were off per pt. Pt denies discharge. No odor.  Pt states she has not really been sexually active just the one time in Dec w/ her childs father.   Improved today but still has some lingering internal itching. States feels like a yeast infxn. She used monistat a couple weeks ago which was not very effective.       HISTORY:    [id number]  Patient's last menstrual period was 08/21/2021 (approximate).  Sexual History:  has sex with males  Contraception:  none  Current Outpatient Medications on File Prior to Visit   Medication Sig Dispense Refill    ibuprofen (ADVIL;MOTRIN) 800 MG tablet Take 1 tablet by mouth every 8 hours as needed for Pain 30 tablet 0     No current facility-administered medications on file prior to visit.       ROS:  Feeling well. No dyspnea or chest pain on exertion.  No abdominal pain, change in bowel habits, black or bloody stools.  No urinary tract symptoms. GYN ROS: she complains of vaginal itching and irritation.    PHYSICAL EXAM:  Blood pressure 116/62, height 5\' 5"  (1.651 m), weight 155 lb (70.3 kg), last menstrual period 08/21/2021, currently breastfeeding.    The patient appears well, alert, oriented x 3, in no distress.  Lungs are clear. Heart RRR, no murmurs. Abdomen soft without tenderness, guarding, mass or organomegaly.  Pelvic: VULVA: normal appearing vulva with no masses, tenderness or lesions, VAGINA: normal appearing vagina with normal color and clear physiologic discharge, no lesions, CERVIX: normal appearing cervix without discharge or lesions.    ASSESSMENT:  Encounter Diagnoses   Name Primary?    Vaginal itching Yes    Vaginal irritation        PLAN:  All questions answered  Diagnosis explained in detail, including differential  Reassured pt of normal appearing discharge and normal  exam  Check nuswab vaginitis plus will tx based on result.    Orders Placed This Encounter   Procedures    Nuswab Vaginitis Plus (VG+)     Standing Status:   Future     Number of Occurrences:   1     Standing Expiration Date:   09/03/2022       Supervising physician is Dr. 11/02/2022.  Greater than 50% of the 20 minute visit were spent in counseling to the above topics.

## 2021-09-04 ENCOUNTER — Telehealth

## 2021-09-04 NOTE — Telephone Encounter (Signed)
Pt was referred to colorectal surgery on 06/28/21 d/t skin tag on rectum.     Pt called today stating she needed to referral as the facility the other referral was sent to does not handle her complaint but they recommended [facility] [place]. Will send new referral today. Pt encouraged to call back if has any problems w/ new referral.

## 2021-09-06 LAB — NUSWAB VAGINITIS PLUS (VG+)
Candida albicans, NAA: NEGATIVE
Candida glabrata: NEGATIVE
Chlamydia trachomatis, NAA: NEGATIVE
Neisseria Gonorrhoeae, NAA: NEGATIVE
Trichomonas Vaginalis by NAA: NEGATIVE

## 2021-09-11 NOTE — Telephone Encounter (Signed)
Pt lvm requesting a call back to discuss lab results, so I called pt back and advised that results were normal. Pt states still having yeast like symptoms, discharge, itching, slight odor. Advised pt I would need to follow up with Danielle to see what she would like to do or recommend.

## 2021-09-16 ENCOUNTER — Encounter

## 2021-09-16 MED ORDER — FLUCONAZOLE 150 MG PO TABS
150 MG | ORAL_TABLET | ORAL | 0 refills | Status: AC
Start: 2021-09-16 — End: ?

## 2022-01-01 ENCOUNTER — Encounter: Payer: PRIVATE HEALTH INSURANCE | Attending: Women's Health | Primary: Internal Medicine

## 2022-01-01 DIAGNOSIS — Z01419 Encounter for gynecological examination (general) (routine) without abnormal findings: Secondary | ICD-10-CM

## 2022-01-01 NOTE — Progress Notes (Unsigned)
Patient presents today for a routine gynecological examination with no complaints.    OB History       Gravida   3    Para   2    Term   2    Preterm        AB   1    Living   2         SAB        IAB        Ectopic        Molar        Multiple        Live Births   2                  GYN History   Last Pap Smear: 07/02/20  Pap: Negative  HPV: was not met.  CT/NG/TV: Negative         No LMP recorded.   Cycle Length {Numbers; 0-100:15068}   Lasting {Numbers; 0-10:33138}  {Pos/neg trace:60575} dysmenorrhea;   {Pos/neg trace:60575} postcoital bleeding    Past Medical History:  Past Medical History:   Diagnosis Date    Anxiety and depression     meds in past       Past Surgical History:  No past surgical history on file.    Allergies:   No Known Allergies    Medication History:  Current Outpatient Medications   Medication Sig Dispense Refill    fluconazole (DIFLUCAN) 150 MG tablet Take one tablet by mouth now and repeat in 72 hours. 2 tablet 0    ibuprofen (ADVIL;MOTRIN) 800 MG tablet Take 1 tablet by mouth every 8 hours as needed for Pain 30 tablet 0     No current facility-administered medications for this visit.       Social History:  Social History     Socioeconomic History    Marital status: Single     Spouse name: Not on file    Number of children: Not on file    Years of education: Not on file    Highest education level: Not on file   Occupational History    Not on file   Tobacco Use    Smoking status: Never    Smokeless tobacco: Never   Substance and Sexual Activity    Alcohol use: Not Currently    Drug use: Never    Sexual activity: Not on file   Other Topics Concern    Not on file   Social History Narrative    Not on file     Social Determinants of Health     Financial Resource Strain: Not on file   Food Insecurity: Not on file   Transportation Needs: Not on file   Physical Activity: Not on file   Stress: Not on file   Social Connections: Not on file   Intimate Partner Violence: Not on file   Housing Stability:  Not on file       Family History:  Family History   Problem Relation Age of Onset    Hypertension Mother        Review of Systems - General ROS: negative except for that discussed in HPI      ROS:  Feeling well. No dyspnea or chest pain on exertion.  No abdominal pain, change in bowel habits, black or bloody stools.  No urinary tract symptoms. No neurological complaints.    Objective:   There were no vitals taken for this  visit.  The patient appears well, alert, oriented x 3, in no distress.  ENT normal.  Neck supple. No adenopathy or thyromegaly.   Lungs:  clear, good air entry, no wheezes, rhonchi or rales.   Heart:  S1 and S2 normal, no murmurs, regular rate and rhythm.  Abdomen:  soft without tenderness, guarding, mass or organomegaly.   Extremities show no edema, normal peripheral pulses.   Neurological is normal, no focal findings.    BREAST EXAM: {pe breast exam:315056::"breasts appear normal, no suspicious masses, no skin or nipple changes or axillary nodes"}    PELVIC EXAM: {pelvic exam:315900::"normal external genitalia, vulva, vagina, cervix, uterus and adnexa"}    Assessment/Plan:     There are no diagnoses linked to this encounter.     {gyn plan:315269::"mammogram","pap smear","return annually or prn"}    Supervising physician is Dr. Marland Kitchen

## 2022-01-09 ENCOUNTER — Encounter: Payer: PRIVATE HEALTH INSURANCE | Attending: Women's Health | Primary: Internal Medicine

## 2022-01-09 DIAGNOSIS — Z01419 Encounter for gynecological examination (general) (routine) without abnormal findings: Secondary | ICD-10-CM

## 2022-01-09 NOTE — Progress Notes (Unsigned)
Patient presents today for a routine gynecological examination with no complaints.    OB History       Gravida   3    Para   2    Term   2    Preterm        AB   1    Living   2         SAB        IAB        Ectopic        Molar        Multiple        Live Births   2                  GYN History   Last Pap Smear: 07/02/20  Pap: Negative  HPV: was not met.   CT/NG/TV: Negative       No LMP recorded.   Cycle Length {Numbers; 0-100:15068}   Lasting {Numbers; 0-10:33138}  {Pos/neg trace:60575} dysmenorrhea;   {Pos/neg trace:60575} postcoital bleeding    Past Medical History:  Past Medical History:   Diagnosis Date    Anxiety and depression     meds in past       Past Surgical History:  No past surgical history on file.    Allergies:   No Known Allergies    Medication History:  Current Outpatient Medications   Medication Sig Dispense Refill    fluconazole (DIFLUCAN) 150 MG tablet Take one tablet by mouth now and repeat in 72 hours. 2 tablet 0    ibuprofen (ADVIL;MOTRIN) 800 MG tablet Take 1 tablet by mouth every 8 hours as needed for Pain 30 tablet 0     No current facility-administered medications for this visit.       Social History:  Social History     Socioeconomic History    Marital status: Single     Spouse name: Not on file    Number of children: Not on file    Years of education: Not on file    Highest education level: Not on file   Occupational History    Not on file   Tobacco Use    Smoking status: Never    Smokeless tobacco: Never   Substance and Sexual Activity    Alcohol use: Not Currently    Drug use: Never    Sexual activity: Not on file   Other Topics Concern    Not on file   Social History Narrative    Not on file     Social Determinants of Health     Financial Resource Strain: Not on file   Food Insecurity: Not on file   Transportation Needs: Not on file   Physical Activity: Not on file   Stress: Not on file   Social Connections: Not on file   Intimate Partner Violence: Not on file   Housing Stability:  Not on file       Family History:  Family History   Problem Relation Age of Onset    Hypertension Mother        Review of Systems - General ROS: negative except for that discussed in HPI      ROS:  Feeling well. No dyspnea or chest pain on exertion.  No abdominal pain, change in bowel habits, black or bloody stools.  No urinary tract symptoms. No neurological complaints.    Objective:   There were no vitals taken for this visit.  The patient appears well, alert, oriented x 3, in no distress.  ENT normal.  Neck supple. No adenopathy or thyromegaly.   Lungs:  clear, good air entry, no wheezes, rhonchi or rales.   Heart:  S1 and S2 normal, no murmurs, regular rate and rhythm.  Abdomen:  soft without tenderness, guarding, mass or organomegaly.   Extremities show no edema, normal peripheral pulses.   Neurological is normal, no focal findings.    BREAST EXAM: {pe breast exam:315056::"breasts appear normal, no suspicious masses, no skin or nipple changes or axillary nodes"}    PELVIC EXAM: {pelvic exam:315900::"normal external genitalia, vulva, vagina, cervix, uterus and adnexa"}    Assessment/Plan:     There are no diagnoses linked to this encounter.     {gyn TGYB:638937::"DSKAJGOTL","XBW smear","return annually or prn"}    Supervising physician is Dr. Marland Kitchen

## 2022-05-28 ENCOUNTER — Ambulatory Visit
Admit: 2022-05-28 | Discharge: 2022-05-28 | Payer: PRIVATE HEALTH INSURANCE | Attending: Women's Health | Primary: Internal Medicine

## 2022-05-28 DIAGNOSIS — Z113 Encounter for screening for infections with a predominantly sexual mode of transmission: Secondary | ICD-10-CM

## 2022-05-28 NOTE — Progress Notes (Signed)
The patient is a 31 y.o. [id number] who is seen for STI Testing. Patient is concerned due to vaginal itching that is localized internally. Patient thinks she has BV. +discharge and irritation.  Patient reports new sex partner 2 wks ago.   Desires blood borne pathogen screening.     Pt also reports mid-back pain that has been ongoing since the birth of her child. Can try PT to see if helps. If continues, rec PCP or ortho eval      HISTORY:  [id number]  Patient's last menstrual period was 05/21/2022 (exact date).  Sexual History:  has sex with males  Contraception:  condoms   No current outpatient medications on file prior to visit.     No current facility-administered medications on file prior to visit.       ROS:  Feeling well. GYN ROS: normal menses, no abnormal bleeding, pelvic pain or discharge, no breast pain or new or enlarging lumps on self exam.    PHYSICAL EXAM:  Blood pressure 100/60, height 5\' 5"  (1.651 m), weight 147 lb 6.4 oz (66.9 kg), last menstrual period 05/21/2022, not currently breastfeeding.    The patient appears well, alert, oriented x 3, in no distress.   Pelvic: VULVA: normal appearing vulva with no masses, tenderness or lesions, VAGINA: normal appearing vagina with normal color and discharge, no lesions, CERVIX: normal appearing cervix without discharge or lesions.    ASSESSMENT:  Encounter Diagnoses   Name Primary?    Routine screening for STI (sexually transmitted infection) Yes    Subacute vaginitis     Back pain, unspecified back location, unspecified back pain laterality, unspecified chronicity        PLAN:  All questions answered  Diagnosis explained in detail, including differential  Disc can take 6 mo for seroconversion of HIV, rec recheck in 77mo  Check nuswab vag+ will tx based on findings.   Safe sex practices.   PT for back pain    Orders Placed This Encounter   Procedures    Nuswab Vaginitis Plus (VG+)     Standing Status:   Future     Number of Occurrences:   1     Standing Expiration  Date:   05/29/2023    HIV 1/2 Ag/Ab, 4TH Generation,W Rflx Confirm     Standing Status:   Future     Number of Occurrences:   1     Standing Expiration Date:   05/29/2023    RPR     Standing Status:   Future     Number of Occurrences:   1     Standing Expiration Date:   05/29/2023    Hepatitis B Surface Antigen     Standing Status:   Future     Number of Occurrences:   1     Standing Expiration Date:   05/29/2023    Hepatitis C Antibody     Standing Status:   Future     Number of Occurrences:   1     Standing Expiration Date:   05/29/2023         Supervising physician is Dr. Kizzie Furnish [place]

## 2022-05-29 LAB — HEPATITIS B SURFACE ANTIGEN: Hepatitis B Surface Ag: NONREACTIVE

## 2022-05-29 LAB — HEPATITIS C ANTIBODY: Hepatitis C Ab: NONREACTIVE

## 2022-05-29 LAB — HIV 1/2 AG/AB, 4TH GENERATION,W RFLX CONFIRM: HIV 1/2 Interp: NONREACTIVE

## 2022-05-29 MED ORDER — TINIDAZOLE 500 MG PO TABS
500 | ORAL_TABLET | Freq: Two times a day (BID) | ORAL | 0 refills | Status: AC
Start: 2022-05-29 — End: 2022-06-03

## 2022-05-29 NOTE — Assessment & Plan Note (Signed)
Associated Problem(s): Attention deficit disorder (ADD) without hyperactivity  Formatting of this note might be different from the original.  Not on medications. Stable.  Electronically signed by Janett Billow, NP at 48/54/6270  3:37 PM EDT

## 2022-05-29 NOTE — Assessment & Plan Note (Signed)
Associated Problem(s): Right hip pain  Formatting of this note might be different from the original.  Consider x-ray in the future since she had lumbar x-ray today. She is agreeable.  Electronically signed by Janett Billow, NP at 42/59/5638  3:37 PM EDT

## 2022-05-29 NOTE — Assessment & Plan Note (Signed)
Associated Problem(s): Chronic bilateral low back pain without sciatica  Formatting of this note might be different from the original.  Reassured. Continue regular exercise and yoga. Can improve over time after epidural. No worrisome findings on x-ray.  Electronically signed by Janett Billow, NP at 80/32/1224  3:37 PM EDT

## 2022-05-29 NOTE — Assessment & Plan Note (Signed)
Associated Problem(s): Seasonal allergic rhinitis due to pollen  Formatting of this note might be different from the original.  Allergies are related to trigger in our environment. They can be temporary or chronic problems. We recommend Zyrtec, Allegra, or Claritin over the counter and nasal sprays as prescribed. If symptoms still persist, please return for further evaluation.    Electronically signed by Janett Billow, NP at 27/25/3664  3:38 PM EDT

## 2022-05-29 NOTE — Progress Notes (Signed)
Orders Placed This Encounter    tinidazole (TINDAMAX) 500 MG tablet     Sig: Take 1 tablet by mouth in the morning and at bedtime for 5 days     Dispense:  10 tablet     Refill:  0      Verbal orders per Erwin with itching with metronidazole

## 2022-05-29 NOTE — Progress Notes (Signed)
Formatting of this note is different from the original.  Images from the original note were not included.  SUBJECTIVE  Chief Complaint:   Chief Complaint   Patient presents with    Establish Care     Pt wants to establish care with a new PCP     HPI:  Sandra Lewis is a 31 y.o. female.     HPI    Patient here to establish care.     Allergic Rhinitis/Rhinoconjunctivitis  Presents with complain of itchy nose, itchy, watery, swollen eyes during spring (pollen), nasal congestion bilaterally, mild post-nasal drip, and intermittent sneezing. Nasal drainage and post-nasal drip are clear. Pertinent negatives include purulent nasal discharge and sinus pain. Symptoms are moderately controlled. Patient is taking Claritin as needed.    Depression  Presents for follow up visit. Symptoms include depressed mood, decreased interest in activities, excess sleep, and reduced appetite. Patient reports no chest pain, anxiety, palpitations, panic or suicidal ideas. Symptoms occur most days. The severity of symptoms is moderate. The symptoms are aggravated by stress and family issues. The quality of sleep is fair. Nighttime awakenings: occasional. Past medical history is not significant for depression. There is no impairment in functioning or feelings of worthlessness. There is no history of suicide attempts.   She has a 30 year old and a 30 year old. She is a single mom and just moved in with her mom. Depressive symptoms have been ongoing since her daughter was born 12/2020. History of ADD not on meds.    Low Back Pain  She had epidural with her daughter 12/2020. She had a contraction right when they did the epidural and has had pain since that time. The pain is right in the location of epidural. She has R hip pain and popping. For exercise, she does yoga, pilates, gym membership (lift weights here and there). For the past month, she hasn't been able to exercise with moving. She is unable to do a mountain climber now. She hears her  hip pop every time. The hip only bothers her with specific activities. Her back doesn't bother her every day. It will go weeks without hurting. She doesn't take anything for it. She doesn't use heating pad or ice. Yoga relieves it. Occasionally numbness down both legs.     Past Medical History:   Past Medical History:   Diagnosis Date    ADD (attention deficit disorder)      Past Surgical History:   Past Surgical History:   Procedure Laterality Date    BUNIONECTOMY Bilateral     2016     Family History:   Family History   Problem Relation Age of Onset    Hypertension Mother     No known problems Father     No known problems Maternal Grandmother     No known problems Maternal Grandfather     No known problems Paternal Grandmother     No known problems Paternal Grandfather     No known problems Son     No known problems Daughter      Alcohol History:  reports current alcohol use.  Drug History:  reports no history of drug use.  Sex History:  has no history on file for sexual activity.  Tobacco History:  reports that she has never smoked. She has never been exposed to tobacco smoke. She has never used smokeless tobacco.  Social History:   Social History     Socioeconomic History    Marital  status: Single   Tobacco Use    Smoking status: Never     Passive exposure: Never    Smokeless tobacco: Never   Substance and Sexual Activity    Alcohol use: Yes     Comment: socially    Drug use: Never     Social Determinants of Health     Social Connections: Unknown (05/27/2022)    Social Connections     Frequency of Communication with Friends and Family: Not asked     Frequency of Social Gatherings with Friends and Family: Not asked   Intimate Partner Violence: Unknown (05/27/2022)    Intimate Partner Violence     Fear of Current or Ex-Partner: Not asked     Emotionally Abused: Not asked     Physically Abused: Not asked     Sexually Abused: Not asked     Current Medications:   Current Outpatient Medications   Medication Sig Dispense  Refill    loratadine (CLARITIN) 10 mg tablet Take 10 mg by mouth once a week as needed      cholecalciferol, vitamin D3, 50 mcg (2,000 unit) Take 1 tablet daily       No current facility-administered medications for this visit.     Immunizations:   There is no immunization history on file for this patient.  Allergies:   Allergies   Allergen Reactions    Metronidazole Itching     ROS: Review of Systems   Constitutional:  Negative for fever and unexpected weight change.   HENT:  Negative for ear pain, rhinorrhea, sinus pain and sore throat.    Eyes:  Negative for pain, discharge and visual disturbance.   Respiratory:  Negative for cough, shortness of breath and wheezing.    Cardiovascular:  Negative for chest pain, palpitations and leg swelling.   Gastrointestinal:  Negative for abdominal pain, diarrhea, nausea and vomiting.   Endocrine: Negative for polydipsia, polyphagia and polyuria.   Genitourinary:  Negative for dysuria, frequency, hematuria and urgency.   Musculoskeletal:  Positive for arthralgias and back pain. Negative for joint swelling and myalgias.   Skin:  Negative for color change and rash.   Neurological:  Negative for dizziness, syncope, weakness and headaches.   Psychiatric/Behavioral:  Positive for dysphoric mood. Negative for confusion.      OBJECTIVE:  Vitals:   Vitals:    05/29/22 1315   BP: 110/72   Pulse: 85   Temp: 97.4 F (36.3 C)   Height: 165.1 cm (65")   Weight: 66.4 kg (146 lb 6.4 oz)   SpO2: 98%   TempSrc: Tympanic     PHQ Depression Screening     PHQ2 Score: 5   PHQ9 Score: 20    PHQ2/9 is interpreted as Positive in this patient    Due to positive depression screen, the following intervention performed:   Referral or follow-up with PCP   Referral or follow-up with a mental health professional     Physical Exam: Physical Exam  Vitals reviewed.   Constitutional:       General: She is not in acute distress.     Appearance: Normal appearance.      Comments: Sitting comfortably in the chair.  Unaccompanied. Pleasant.   HENT:      Head: Atraumatic.      Right Ear: External ear normal.      Left Ear: External ear normal.      Nose: Nose normal.      Mouth/Throat:  Mouth: Mucous membranes are moist.   Eyes:      Extraocular Movements: Extraocular movements intact.      Conjunctiva/sclera: Conjunctivae normal.      Pupils: Pupils are equal, round, and reactive to light.   Cardiovascular:      Rate and Rhythm: Normal rate and regular rhythm.      Heart sounds: Normal heart sounds. No murmur heard.  Pulmonary:      Effort: Pulmonary effort is normal.      Breath sounds: Normal breath sounds. No wheezing, rhonchi or rales.   Musculoskeletal:         General: Normal range of motion.      Lumbar back: Tenderness present. No swelling, edema, deformity, signs of trauma or lacerations. Normal range of motion.        Back:       Right hip: Bony tenderness (on the trochanter) present. Normal range of motion.   Skin:     General: Skin is warm and dry.   Neurological:      Mental Status: She is alert and oriented to person, place, and time. Mental status is at baseline.      Cranial Nerves: Cranial nerves 2-12 are intact.      Motor: Motor function is intact.      Gait: Gait is intact.   Psychiatric:         Mood and Affect: Mood and affect normal.         Speech: Speech normal.         Behavior: Behavior normal.     ASSESSMENT AND PLAN  Visit Diagnosis:    Diagnosis Plan   1. Chronic bilateral low back pain without sciatica  XR Spine Lumbar 2 or 3 Vw     2. Severe depression [facility])  Referral to Psychiatry - [place]     3. Right hip pain       4. Attention deficit disorder (ADD) without hyperactivity       5. Seasonal allergic rhinitis due to pollen         Current Medical Problems:  Problem List as of 05/29/2022 Reviewed: 05/29/2022  3:37 PM by Leander Rams, NP      Chronic bilateral low back pain without sciatica - Primary    Last Assessment & Plan 05/29/2022 Office Visit Written 05/29/2022  3:37 PM by  Leander Rams, NP     Reassured. Continue regular exercise and yoga. Can improve over time after epidural. No worrisome findings on x-ray.       Severe depression [facility])    Last Assessment & Plan 05/29/2022 Office Visit Written 05/29/2022  3:38 PM by Leander Rams, NP     Discussed SSRIs. She is not interested in medications. Refer to psych. Discussed long wait times. Also given phone # and address for [facility]. Advised to call for counseling.    To reduce your stress and improve you mood, we recommend activities such as meditation, yoga, and regular exercise. If any thoughts of harming self or others, follow up immediately or call 911.         Right hip pain    Last Assessment & Plan 05/29/2022 Office Visit Edited 05/29/2022  3:37 PM by Leander Rams, NP     Consider x-ray in the future since she had lumbar x-ray today. She is agreeable.       Attention deficit disorder (ADD) without hyperactivity    Last Assessment & Plan 05/29/2022  Office Visit Written 05/29/2022  3:37 PM by Leander Rams, NP     Not on medications. Stable.       Seasonal allergic rhinitis due to pollen    Last Assessment & Plan 05/29/2022 Office Visit Written 05/29/2022  3:38 PM by Leander Rams, NP     Allergies are related to trigger in our environment. They can be temporary or chronic problems. We recommend Zyrtec, Allegra, or Claritin over the counter and nasal sprays as prescribed. If symptoms still persist, please return for further evaluation.          PROCEDURE: Procedures    Orders Placed This Visit:   Orders Placed This Encounter   Procedures    XR Spine Lumbar 2 or 3 Vw     Order Specific Question:   Select reason for exam method:     Answer:   Free Text Reason and Hx     Order Specific Question:   Enter free text reason/hx     Answer:   Low back pain    Referral to Psychiatry - [place]     Standing Status:   Future     Standing Expiration Date:   08/30/2023     Referral Priority:    Routine     Referral Type:   Psychiatric     Referral Reason:   Specialty Services Required     Number of Visits Requested:   1       Follow Up: Return in about 2 weeks (around 06/12/2022) for Physical.    Patient was also seen by Dr. Oda Cogan including discussing assessment, diagnosis, and treatment plan with the patient.    Portions of this note including the HPI, physical exam, and assessment may have been copied from previous notes; however, these sections have all been reviewed by me today and are either unchanged or have been updated.       Electronically signed by Tyrone Sage, MD at 05/29/2022  4:18 PM EDT    Associated attestation - Tyrone Sage, MD - 05/29/2022  4:18 PM EDT  Formatting of this note might be different from the original.  I have reviewed the note, assessment, plan, and/or procedures performed by the Advanced Practice Provider (APP). I agree with his/her documentation of the patient. My involvement in this patient's care was limited to indirect supervision unless otherwise noted. I was immediately available for both consultation and direct evaluation of the patient if requested by the APP during the time the patient was in the office.    Tyrone Sage, MD

## 2022-05-29 NOTE — Assessment & Plan Note (Signed)
Associated Problem(s): Severe depression (HCC)  Formatting of this note might be different from the original.  Discussed SSRIs. She is not interested in medications. Refer to psych. Discussed long wait times. Also given phone # and address for [facility]. Advised to call for counseling.    To reduce your stress and improve you mood, we recommend activities such as meditation, yoga, and regular exercise. If any thoughts of harming self or others, follow up immediately or call 911.    Electronically signed by Janett Billow, NP at 99/37/1696  3:38 PM EDT

## 2022-05-30 LAB — RPR: RPR: NONREACTIVE

## 2022-05-31 LAB — NUSWAB VAGINITIS PLUS (VG+)
Candida albicans, NAA: NEGATIVE
Candida glabrata: NEGATIVE
Chlamydia trachomatis, NAA: NEGATIVE
Neisseria Gonorrhoeae, NAA: NEGATIVE
Trichomonas Vaginalis by NAA: NEGATIVE

## 2022-06-02 ENCOUNTER — Encounter: Payer: PRIVATE HEALTH INSURANCE | Attending: Women's Health | Primary: Internal Medicine

## 2022-06-02 NOTE — Progress Notes (Deleted)
Patient presents today for a routine gynecological examination with no complaints.    Pt seen 05/28/22 for STI testing, labs/Nuswab sent    OB History       Gravida   3    Para   2    Term   2    Preterm        AB   1    Living   2         SAB        IAB   1    Ectopic        Molar        Multiple        Live Births   2              Last pap: 07/02/20 Neg, STD Neg    GYN History           Patient's last menstrual period was 05/21/2022 (exact date). Cycle Length {Numbers; 0-100:15068} Lasting {Numbers; 0-10:33138}  {Pos/neg trace:60575} dysmenorrhea; {Pos/neg trace:60575} postcoital bleeding    Past Medical History:  Past Medical History:   Diagnosis Date    Anxiety and depression     meds in past       Past Surgical History:  No past surgical history on file.    Allergies:   Allergies   Allergen Reactions    Metronidazole Itching       Medication History:  Current Outpatient Medications   Medication Sig Dispense Refill    tinidazole (TINDAMAX) 500 MG tablet Take 1 tablet by mouth in the morning and at bedtime for 5 days 10 tablet 0     No current facility-administered medications for this visit.       Social History:  Social History     Socioeconomic History    Marital status: Single     Spouse name: Not on file    Number of children: Not on file    Years of education: Not on file    Highest education level: Not on file   Occupational History    Not on file   Tobacco Use    Smoking status: Never    Smokeless tobacco: Never   Substance and Sexual Activity    Alcohol use: Not Currently    Drug use: Never    Sexual activity: Yes     Partners: Male     Birth control/protection: Condom   Other Topics Concern    Not on file   Social History Narrative    Not on file     Social Determinants of Health     Financial Resource Strain: Not on file   Food Insecurity: Not on file   Transportation Needs: Not on file   Physical Activity: Not on file   Stress: Not on file   Social Connections: Not on file   Intimate Partner Violence:  Not on file   Housing Stability: Not on file       Family History:  Family History   Problem Relation Age of Onset    Hypertension Mother        Review of Systems - General ROS: negative except for that discussed in HPI      ROS:  Feeling well. No dyspnea or chest pain on exertion.  No abdominal pain, change in bowel habits, black or bloody stools.  No urinary tract symptoms. No neurological complaints.    Objective:   LMP 05/21/2022 (Exact Date)   The  patient appears well, alert, oriented x 3, in no distress.  ENT normal.  Neck supple. No adenopathy or thyromegaly.   Lungs:  clear, good air entry, no wheezes, rhonchi or rales.   Heart:  S1 and S2 normal, no murmurs, regular rate and rhythm.  Abdomen:  soft without tenderness, guarding, mass or organomegaly.   Extremities show no edema, normal peripheral pulses.   Neurological is normal, no focal findings.    BREAST EXAM: {pe breast exam:315056::"breasts appear normal, no suspicious masses, no skin or nipple changes or axillary nodes"}    PELVIC EXAM: {pelvic exam:315900::"normal external genitalia, vulva, vagina, cervix, uterus and adnexa"}    Assessment/Plan:     1. Well woman exam  ***    2. Screening for genitourinary condition  ***       {gyn plan:315269::"mammogram","pap smear","return annually or prn"}    Supervising physician is Dr. Marland Kitchen    Marina Goodell, RN

## 2022-06-04 ENCOUNTER — Encounter: Payer: PRIVATE HEALTH INSURANCE | Attending: Women's Health | Primary: Internal Medicine

## 2022-06-04 DIAGNOSIS — Z01419 Encounter for gynecological examination (general) (routine) without abnormal findings: Secondary | ICD-10-CM

## 2022-06-04 NOTE — Progress Notes (Deleted)
Patient presents today for a routine gynecological examination with no complaints.    OB History       Gravida   3    Para   2    Term   2    Preterm        AB   1    Living   2         SAB        IAB   1    Ectopic        Molar        Multiple        Live Births   2              Last pap: 07/02/20 - Nilm, HPV criteria not met, STD neg   Last mammo: N/A     GYN History     Patient's last menstrual period was 05/21/2022 (exact date). Cycle Length {Numbers; 0-100:15068} Lasting {Numbers; 0-10:33138}  {Pos/neg trace:60575} dysmenorrhea; {Pos/neg trace:60575} postcoital bleeding    Past Medical History:  Past Medical History:   Diagnosis Date    Anxiety and depression     meds in past       Past Surgical History:  No past surgical history on file.    Allergies:   Allergies   Allergen Reactions    Metronidazole Itching       Medication History:  Current Outpatient Medications   Medication Sig Dispense Refill    tinidazole (TINDAMAX) 500 MG tablet Take 1 tablet by mouth in the morning and at bedtime for 5 days 10 tablet 0     No current facility-administered medications for this visit.       Social History:  Social History     Socioeconomic History    Marital status: Single     Spouse name: Not on file    Number of children: Not on file    Years of education: Not on file    Highest education level: Not on file   Occupational History    Not on file   Tobacco Use    Smoking status: Never    Smokeless tobacco: Never   Substance and Sexual Activity    Alcohol use: Not Currently    Drug use: Never    Sexual activity: Yes     Partners: Male     Birth control/protection: Condom   Other Topics Concern    Not on file   Social History Narrative    Not on file     Social Determinants of Health     Financial Resource Strain: Not on file   Food Insecurity: Not on file   Transportation Needs: Not on file   Physical Activity: Not on file   Stress: Not on file   Social Connections: Not on file   Intimate Partner Violence: Not on file    Housing Stability: Not on file       Family History:  Family History   Problem Relation Age of Onset    Hypertension Mother        Review of Systems - General ROS: negative except for that discussed in HPI      ROS:  Feeling well. No dyspnea or chest pain on exertion.  No abdominal pain, change in bowel habits, black or bloody stools.  No urinary tract symptoms. No neurological complaints.    Objective:   LMP 05/21/2022 (Exact Date)   The patient appears well, alert, oriented x  3, in no distress.  ENT normal.  Neck supple. No adenopathy or thyromegaly.   Lungs:  clear, good air entry, no wheezes, rhonchi or rales.   Heart:  S1 and S2 normal, no murmurs, regular rate and rhythm.  Abdomen:  soft without tenderness, guarding, mass or organomegaly.   Extremities show no edema, normal peripheral pulses.   Neurological is normal, no focal findings.    BREAST EXAM: {pe breast exam:315056::"breasts appear normal, no suspicious masses, no skin or nipple changes or axillary nodes"}    PELVIC EXAM: {pelvic exam:315900::"normal external genitalia, vulva, vagina, cervix, uterus and adnexa"}    Assessment/Plan:     There are no diagnoses linked to this encounter.     {gyn ALPF:790240::"XBDZHGDJM","EQA smear","return annually or prn"}    Supervising physician is Dr. Marland Kitchen    Dishawn Bhargava [place] , [place]

## 2022-06-06 ENCOUNTER — Encounter: Payer: PRIVATE HEALTH INSURANCE | Attending: Women's Health | Primary: Internal Medicine

## 2022-06-06 NOTE — Progress Notes (Deleted)
Patient presents today for a routine gynecological examination with no complaints.    OB History       Gravida   3    Para   2    Term   2    Preterm        AB   1    Living   2         SAB        IAB   1    Ectopic        Molar        Multiple        Live Births   2              07/02/2020 Negative     GYN History           Patient's last menstrual period was 05/21/2022 (exact date). Cycle Length {Numbers; 0-100:15068} Lasting {Numbers; 0-10:33138}  {Pos/neg trace:60575} dysmenorrhea; {Pos/neg trace:60575} postcoital bleeding    Past Medical History:  Past Medical History:   Diagnosis Date    Anxiety and depression     meds in past       Past Surgical History:  No past surgical history on file.    Allergies:   Allergies   Allergen Reactions    Metronidazole Itching       Medication History:  No current outpatient medications on file.     No current facility-administered medications for this visit.       Social History:  Social History     Socioeconomic History    Marital status: Single     Spouse name: Not on file    Number of children: Not on file    Years of education: Not on file    Highest education level: Not on file   Occupational History    Not on file   Tobacco Use    Smoking status: Never    Smokeless tobacco: Never   Substance and Sexual Activity    Alcohol use: Not Currently    Drug use: Never    Sexual activity: Yes     Partners: Male     Birth control/protection: Condom   Other Topics Concern    Not on file   Social History Narrative    Not on file     Social Determinants of Health     Financial Resource Strain: Not on file   Food Insecurity: Not on file   Transportation Needs: Not on file   Physical Activity: Not on file   Stress: Not on file   Social Connections: Not on file   Intimate Partner Violence: Not on file   Housing Stability: Not on file       Family History:  Family History   Problem Relation Age of Onset    Hypertension Mother        Review of Systems - General ROS: negative except for  that discussed in HPI      ROS:  Feeling well. No dyspnea or chest pain on exertion.  No abdominal pain, change in bowel habits, black or bloody stools.  No urinary tract symptoms. No neurological complaints.    Objective:   LMP 05/21/2022 (Exact Date)   The patient appears well, alert, oriented x 3, in no distress.  ENT normal.  Neck supple. No adenopathy or thyromegaly.   Lungs:  clear, good air entry, no wheezes, rhonchi or rales.   Heart:  S1 and S2 normal, no  murmurs, regular rate and rhythm.  Abdomen:  soft without tenderness, guarding, mass or organomegaly.   Extremities show no edema, normal peripheral pulses.   Neurological is normal, no focal findings.    BREAST EXAM: {pe breast exam:315056::"breasts appear normal, no suspicious masses, no skin or nipple changes or axillary nodes"}    PELVIC EXAM: {pelvic exam:315900::"normal external genitalia, vulva, vagina, cervix, uterus and adnexa"}    Assessment/Plan:     There are no diagnoses linked to this encounter.     {gyn plan:315269::"mammogram","pap smear","return annually or prn"}    Supervising physician is Dr. Marland Kitchen

## 2022-06-10 ENCOUNTER — Ambulatory Visit
Admit: 2022-06-10 | Discharge: 2022-06-10 | Payer: PRIVATE HEALTH INSURANCE | Attending: Women's Health | Primary: Internal Medicine

## 2022-06-10 DIAGNOSIS — Z01419 Encounter for gynecological examination (general) (routine) without abnormal findings: Secondary | ICD-10-CM

## 2022-06-10 LAB — AMB POC URINALYSIS DIP STICK MANUAL W/O MICRO
Bilirubin, Urine, POC: NEGATIVE
Glucose, Urine, POC: NEGATIVE
Ketones, Urine, POC: NEGATIVE
Leukocyte Esterase, Urine, POC: NEGATIVE
Nitrite, Urine, POC: NEGATIVE
Protein, Urine, POC: NEGATIVE
Specific Gravity, Urine, POC: 1.02 (ref 1.001–1.035)
Urobilinogen, POC: 0.2
pH, Urine, POC: 6 (ref 4.6–8.0)

## 2022-06-10 MED ORDER — SERTRALINE HCL 50 MG PO TABS
50 MG | ORAL_TABLET | Freq: Every day | ORAL | 5 refills | Status: AC
Start: 2022-06-10 — End: ?

## 2022-06-10 NOTE — Telephone Encounter (Signed)
Patient was seen today and prescribed Zoloft 50 mg.

## 2022-06-10 NOTE — Progress Notes (Signed)
Patient presents today for a routine gynecological examination with no complaints.    Last pap smear: 07/02/2020 Negative. Std's negative. HPV not tested.     PT states having some depressive and anxiety sx. Pt feels started after childbirth and has worsened with time. + increased crying.   + anhedonia +sadness +decreased motivation  No s/h ideation  Feels overwhelmed.   +anxiety.   Has no prior hx anxiety/depression.       OB History       Gravida   3    Para   2    Term   2    Preterm        AB   1    Living   2         SAB        IAB   1    Ectopic        Molar        Multiple        Live Births   2                  GYN History           Patient's last menstrual period was 05/21/2022 (exact date). Cycle Length 28 Lasting 7  negative dysmenorrhea; negative postcoital bleeding    Past Medical History:  Past Medical History:   Diagnosis Date    Anxiety and depression     meds in past       Past Surgical History:  History reviewed. No pertinent surgical history.    Allergies:   Allergies   Allergen Reactions    Metronidazole Itching       Medication History:  No current outpatient medications on file.     No current facility-administered medications for this visit.       Social History:  Social History     Socioeconomic History    Marital status: Single     Spouse name: Not on file    Number of children: Not on file    Years of education: Not on file    Highest education level: Not on file   Occupational History    Not on file   Tobacco Use    Smoking status: Never    Smokeless tobacco: Never   Substance and Sexual Activity    Alcohol use: Not Currently    Drug use: Never    Sexual activity: Yes     Partners: Male     Birth control/protection: Condom   Other Topics Concern    Not on file   Social History Narrative    Not on file     Social Determinants of Health     Financial Resource Strain: Not on file   Food Insecurity: Not on file   Transportation Needs: Not on file   Physical Activity: Not on file   Stress: Not  on file   Social Connections: Not on file   Intimate Partner Violence: Not on file   Housing Stability: Not on file       Family History:  Family History   Problem Relation Age of Onset    Hypertension Mother        Review of Systems - General ROS: negative except for that discussed in HPI      ROS:  Feeling well. No dyspnea or chest pain on exertion.  No abdominal pain, change in bowel habits, black or bloody stools.  No urinary tract  symptoms. No neurological complaints.    Objective:   BP 102/68   Ht 5\' 5"  (1.651 m)   Wt 148 lb 12.8 oz (67.5 kg)   LMP 05/21/2022 (Exact Date)   BMI 24.76 kg/m   The patient appears well, alert, oriented x 3, in no distress.  ENT normal.  Neck supple. No adenopathy or thyromegaly.   Lungs:  clear, good air entry, no wheezes, rhonchi or rales.   Heart:  S1 and S2 normal, no murmurs, regular rate and rhythm.  Abdomen:  soft without tenderness, guarding, mass or organomegaly.   Extremities show no edema, normal peripheral pulses.   Neurological is normal, no focal findings.    BREAST EXAM: breasts appear normal, no suspicious masses, no skin or nipple changes or axillary nodes, risk and benefit of breast self-exam was discussed    PELVIC EXAM: VULVA: normal appearing vulva with no masses, tenderness or lesions, VAGINA: normal appearing vagina with normal color and discharge, no lesions, CERVIX: normal appearing cervix without discharge or lesions, UTERUS: uterus is normal size, shape, consistency and nontender, ADNEXA: normal adnexa in size, nontender and no masses    Assessment/Plan:     1. Well woman exam    - AMB POC URINALYSIS DIP STICK MANUAL W/O MICRO  - PAP IG, HPV Rfx HPV 16/18,45; Future    2. Screening for genitourinary condition    - AMB POC URINALYSIS DIP STICK MANUAL W/O MICRO    3. Screening for human papillomavirus (HPV)    - PAP IG, HPV Rfx HPV 16/18,45; Future    4. Screening for cervical cancer    - PAP IG, HPV Rfx HPV 16/18,45; Future    5. Postpartum  depression      6. Anxiety       Start zoloft 50mg  daily  Disc importance of compliance  Reviewed SE and benefits. Wishes to start.   Med recheck 6wk  pap smear  return annually or prn    Supervising physician is Dr. 11-[birth year]-1970.    , APRN - CNP

## 2022-06-11 NOTE — Progress Notes (Signed)
Formatting of this note is different from the original.  SUBJECTIVE  Chief Complaint:   Chief Complaint   Patient presents with    Annual Exam     Physical     HPI:  Sandra Lewis is a 31 y.o. female.     HPI    Patient presents today for yearly physical.    She saw GYN 06/10/22 and started on Zoloft. She is advised she can take 1/2 tab 50 mg x 1 week and then increase to 50 mg. Side effects again reviewed. She is taking many supplements and asking about interactions. She is not taking Norton. Discussed likely ok to combine other supplements(ashwaghanda, etc) but advised she can discuss with pharmacist too. She will check on vaccine records.    Past Medical History:   Past Medical History:   Diagnosis Date    ADD (attention deficit disorder)      Past Surgical History:   Past Surgical History:   Procedure Laterality Date    BUNIONECTOMY Bilateral     2016     Family History:   Family History   Problem Relation Age of Onset    Hypertension Mother     No known problems Father     No known problems Maternal Grandmother     No known problems Maternal Grandfather     No known problems Paternal Grandmother     No known problems Paternal Grandfather     No known problems Son     No known problems Daughter      Alcohol History:  reports current alcohol use.  Drug History:  reports no history of drug use.  Sex History:  has no history on file for sexual activity.  Tobacco History:  reports that she has never smoked. She has never been exposed to tobacco smoke. She has never used smokeless tobacco.  Social History:   Social History     Socioeconomic History    Marital status: Single   Tobacco Use    Smoking status: Never     Passive exposure: Never    Smokeless tobacco: Never   Substance and Sexual Activity    Alcohol use: Yes     Comment: socially    Drug use: Never     Social Determinants of Health     Financial Resource Strain: Low Risk  (06/11/2022)    Financial Resource Strain     Difficulty Paying Living  Expenses: Not hard at all     Difficulty Paying Medical Expenses: No   Food Insecurity: No Food Insecurity (06/11/2022)    Food Insecurity     Worried about Charity fundraiser in the Last Year: Never true     Ran Out of Food in the Last Year: Never true   Transportation Needs: No Transportation Needs (06/11/2022)    Transportation Needs     Lack of Transportation: No   Physical Activity: Insufficiently Active (06/11/2022)    Physical Activity     Days of Exercise per Week: 3     Minutes of Exercise per Session: 30     Total Minutes of Exercise per Week: 90   Stress: Stress Concern Present (06/11/2022)    Stress     Feeling of Stress : To some extent   Social Connections: Socially Integrated (06/11/2022)    Social Connections     Frequency of Communication with Friends and Family: More than three times a week     Frequency of  Social Gatherings with Friends and Family: More than three times a week   Intimate Partner Violence: Not At Risk (06/11/2022)    Intimate Partner Violence     Fear of Current or Ex-Partner: No     Emotionally Abused: No     Physically Abused: No     Sexually Abused: No   Housing Stability: Not At Risk (06/11/2022)    Housing Stability     Was there a time when you did not have a steady place to sleep: No     Worried that the place you are staying is making you sick: No     Current Medications:   Current Outpatient Medications   Medication Sig Dispense Refill    cholecalciferol, vitamin D3, 50 mcg (2,000 unit) Take 1 tablet daily      loratadine (CLARITIN) 10 mg tablet Take 10 mg by mouth once a week as needed       No current facility-administered medications for this visit.     Immunizations:   Immunization History   Administered Date(s) Administered    Tdap 08/25/2013     Allergies:   Allergies   Allergen Reactions    Metronidazole Itching     ROS: Review of Systems   Constitutional:  Negative for fever and unexpected weight change.   HENT:  Negative for ear pain, rhinorrhea, sinus pain and  sore throat.    Eyes:  Negative for pain, discharge and visual disturbance.   Respiratory:  Negative for cough, shortness of breath and wheezing.    Cardiovascular:  Negative for chest pain, palpitations and leg swelling.   Gastrointestinal:  Negative for abdominal pain, diarrhea, nausea and vomiting.   Endocrine: Negative for polydipsia, polyphagia and polyuria.   Genitourinary:  Negative for dysuria, frequency, hematuria and urgency.   Musculoskeletal:  Positive for arthralgias and back pain. Negative for joint swelling and myalgias.   Skin:  Negative for color change and rash.   Neurological:  Negative for dizziness, syncope, weakness and headaches.   Psychiatric/Behavioral:  Positive for dysphoric mood. Negative for confusion.      OBJECTIVE:  Vitals:   Vitals:    06/11/22 1026   BP: 112/70   Pulse: 75   Temp: 97.8 F (36.6 C)   Height: 165.1 cm (65")   Weight: 67.9 kg (149 lb 9.6 oz)   SpO2: 98%   TempSrc: Tympanic     Physical Exam: Physical Exam  Vitals reviewed.   Constitutional:       General: She is not in acute distress.     Appearance: Normal appearance. She is well-developed. She is not ill-appearing.      Comments: Sitting comfortably on the exam table. Accompanied by daughter. Pleasant.    HENT:      Head: Normocephalic.      Right Ear: Tympanic membrane, ear canal and external ear normal.      Left Ear: Tympanic membrane, ear canal and external ear normal.      Nose: Nose normal.      Mouth/Throat:      Mouth: Mucous membranes are moist.      Pharynx: Oropharynx is clear.   Eyes:      General: Lids are normal.      Extraocular Movements: Extraocular movements intact.      Conjunctiva/sclera: Conjunctivae normal.      Pupils: Pupils are equal, round, and reactive to light.   Neck:      Thyroid: No thyroid mass or thyromegaly.  Vascular: No carotid bruit.   Cardiovascular:      Rate and Rhythm: Normal rate and regular rhythm.      Heart sounds: Normal heart sounds. No murmur heard.  Pulmonary:       Effort: Pulmonary effort is normal. No respiratory distress.      Breath sounds: Normal breath sounds. No wheezing or rhonchi.   Chest:   Breasts:     Right: Normal. No swelling, bleeding, inverted nipple, mass, nipple discharge, skin change or tenderness.      Left: Normal. No swelling, bleeding, inverted nipple, mass, nipple discharge, skin change or tenderness.   Abdominal:      General: Abdomen is flat. Bowel sounds are normal. There is no abdominal bruit.      Palpations: Abdomen is soft. There is no mass.      Tenderness: There is no abdominal tenderness. There is no guarding.      Hernia: No hernia is present.   Musculoskeletal:         General: Normal range of motion.      Cervical back: Normal range of motion and neck supple.      Right lower leg: No edema.      Left lower leg: No edema.   Lymphadenopathy:      Head:      Right side of head: No submental, submandibular, preauricular, posterior auricular or occipital adenopathy.      Left side of head: No submental, submandibular, preauricular, posterior auricular or occipital adenopathy.      Cervical: No cervical adenopathy.      Upper Body:      Right upper body: No supraclavicular or axillary adenopathy.      Left upper body: No supraclavicular or axillary adenopathy.   Skin:     General: Skin is warm and dry.   Neurological:      General: No focal deficit present.      Mental Status: She is alert and oriented to person, place, and time. Mental status is at baseline.      Cranial Nerves: Cranial nerves 2-12 are intact.      Sensory: Sensation is intact.      Motor: Motor function is intact.      Coordination: Coordination is intact.      Gait: Gait is intact.   Psychiatric:         Attention and Perception: Attention normal.         Mood and Affect: Mood and affect normal.         Speech: Speech normal.         Behavior: Behavior normal. Behavior is cooperative.         Thought Content: Thought content normal.         Cognition and Memory: Cognition and  memory normal.         Judgment: Judgment normal.     ASSESSMENT AND PLAN  Visit Diagnosis:    Diagnosis Plan   1. Annual physical exam  ECG 12 Lead    Urinalysis, Dipstick Visual without Microscopic-POC    Comprehensive Metabolic Panel (CMP)    Lipid Panel with LDL/HDL    CBC w/Differential     2. Lipid screening  Lipid Panel with LDL/HDL       Current Medical Problems:  Problem List as of 06/11/2022 Reviewed: 06/11/2022 10:37 AM by Alfonse Alpers, CMA   None    PROCEDURE: Procedures    Orders Placed This Visit:  Orders Placed This Encounter   Procedures    Comprehensive Metabolic Panel (CMP)     Order Specific Question:   Release to patient     Answer:   Immediate    Lipid Panel with LDL/HDL     Order Specific Question:   Release to patient     Answer:   Immediate    CBC w/Differential     Order Specific Question:   Release to patient     Answer:   Immediate    Urinalysis, Dipstick Visual without Microscopic-POC     Order Specific Question:   Release to patient     Answer:   Immediate    ECG 12 Lead     Order Specific Question:   Reason for EKG?     Answer:   Other     Order Specific Question:   Reason for EKG     Answer:   Annual physical exam [[id number]]       Follow Up: Return in about 1 week (around 06/18/2022) for Hip x-ray, 1 year (Physical).    Dr. Renaee Munda was on site and/or available for consultation.    Portions of this note including the HPI, physical exam, and assessment may have been copied from previous notes. However, these sections have all been reviewed by me today, and are either unchanged, or have been updated.       Electronically signed by Tyrone Sage, MD at 06/11/2022 11:59 AM EDT    Associated attestation - Tyrone Sage, MD - 06/11/2022 11:59 AM EDT  Formatting of this note might be different from the original.  I have reviewed the note, assessment, plan, and/or procedures performed by the Advanced Practice Provider (APP). I agree with his/her documentation  of the patient. My involvement in this patient's care was limited to indirect supervision unless otherwise noted. I was immediately available for both consultation and direct evaluation of the patient if requested by the APP during the time the patient was in the office.    Tyrone Sage, MD

## 2022-06-11 NOTE — Patient Instructions (Signed)
Formatting of this note is different from the original.  Images from the original note were not included.      [facility]  Epic User     06/11/2022    Phone: N/A  Email: [email or web address]  Address: [address]  [place], [place] [address]  Hours: Mon-Fri 8 AM - 5 PM     Group fitness classes             [facility] [address] [facility]   Distance: 1.84 miles         [address]  [place], [place] [address]  Language: English  Fees: Self Pay         Phone: [phone] Email: info@[facility] .org Website: https://www.hill.org/           [facility] [place] [facility]: 2.71 miles         [address]  [place], [place] [address]  Language: [place]  Fees: Self Pay         Phone: (431)[phone] Email: info@[facility] .org Website: MrFebruary.uy            Gym or workout facility             [facility] [facility] [place] [facility]   Distance: 0.54 miles         [address]  [place], [place] [address]  Language: [place], Spanish  Fees: Free, Self Pay         Phone: [phone] Email: aduffie@mauldinrecreation .com Website: http://www.shaw-martin.org/           [facility]   Distance: 1.84 miles         [address]  [place], [place] [address]  Language: English  Fees: Self Pay         Phone: [phone] Email: info@[facility] .org Website: https://www.hill.org/            Recreation or community center             [facility] [place] - [place]   Distance: 5.27 miles         [address] [place], [place] [address]  Language: [place]  Fees: Free, Self Pay         Phone: 2672191996 Email: anjohnson@[place] .org Website:  dDotCom.si           [facility] [place] - [facility]: 7.59 miles         [address]  [place], [place] [address]  Language: [place]  Fees: Free, Self Pay         Phone: ([phone] Email: PBrooks@[place] .org Website: PrepaidParty.no            Sports clubs and recreational activities             [facility] [place] [facility]   Distance: 1.84 miles         [address]  [place], [place] [address]  Language: English  Fees: Self Pay         Phone: [phone] Email: info@[facility] .org Website: https://www.hill.org/           [facility] [place] [facility]   Distance: 2.71 miles         [address] [place] [address]  Language: English  Fees: Self Pay         Phone: [phone] Email: info@[facility] .org Website: http://www.bailey-miller.org/            Individual counseling             [facility]   Distance: 1[place] [address]  Language: [place]olt  Fees: Insurance, Self Pay         Phone: [phone] Email: Sacredhearttherapeutic@gmail .com Website: https://www.psychologytoday.com/us/therapists/ingrid-[place]ling-[facility]-therapeutic-[place]/952054           [facility] [place]   Distance: 2.23 [place] [address]  Language: [place]  Fees: Insurance, Self Pay         Phone: [phone] Ext. 1 Email: referrals@[facility] -[facility].org Website: https://www.gateway-counseling.org/            Mental health support group             [facility]   Distance: 3.03 miles         [address] [place] [place] [address]  Language: [place]  Fees: Insurance, Self Pay          Phone: (531) [phone] Email: Jaquinda@jsjacksonconsultings .com Website: IndividualReport.nl           [facility]   Distance: 4.5 miles         [address] [place] [address]  Language: [place], [place], [place]  Fees: Insurance, Self Pay         Phone: [phone] Email: wecare@[facility] .com Website: MileAwards.is           Important Numbers and Websites    Emergency Services  [place]  [phone] (TALK)  [place]  [phone] (RUNAWAY) [place]  [phone] (4-A-Child)  All[facility]Options Talkline  [phone]  [place]  [phone]  Sexual Assault Hotline  ([phone] (HOPE)  Substance Abuse Referral  (800) 316-423-7590     Your code from today's visit is [id number]  To see this [facility] online, please visit [email or web address]     DISCLAIMER: [employer] does not endorse any service providers mentioned in this [facility]. [employer] does not guarantee that the services mentioned in this [facility] will be available to you or will improve your health or wellness.    Electronically signed by Cyndra Numbers Rtf Notes at 06/11/2022 10:36 AM EDT

## 2022-06-12 ENCOUNTER — Inpatient Hospital Stay: Payer: PRIVATE HEALTH INSURANCE | Primary: Internal Medicine

## 2022-06-12 DIAGNOSIS — M5459 Other low back pain: Secondary | ICD-10-CM

## 2022-06-12 NOTE — Progress Notes (Signed)
Clydene Fake  DOB: September 11, 1990  Primary: [facility]  Secondary:  [facility]  [address]  [place] [address]  Phone: [phone]  Fax: [phone] No data recorded  No data recorded    PT Visit Info:  No data recorded       OUTPATIENT PHYSICAL THERAPY 06/12/2022     Appt Desk   Episode   MyChart      Ms. Mcnicholas was a cancellation mistake in appointment time      Lesleigh Noe, PT    Future Appointments   Date Time Provider [place]   06/24/2022  1:00 PM Desdune-Mont, Aarionna Germer C, PT [facility]

## 2022-06-17 LAB — PAP IG, HPV RFX HPV 16/18,45: HPV Aptima: NEGATIVE

## 2022-06-24 ENCOUNTER — Inpatient Hospital Stay: Admit: 2022-06-24 | Payer: PRIVATE HEALTH INSURANCE

## 2022-06-24 DIAGNOSIS — M5459 Other low back pain: Secondary | ICD-10-CM

## 2022-06-24 NOTE — Other (Incomplete)
Sandra Lewis  DOB: 1990/09/03  Primary: [place] (Medicaid Managed)  Secondary:  [facility]  [address]  [address]  [place] [address]  Phone: [phone]  Fax: [phone]           Plan of Care/Certification Expiration Date:     Time In/Out:   Time In: 1300  Time Out: 1345  PT Visit Info:         Visit Count:  1                OUTPATIENT PHYSICAL THERAPY:             Initial Assessment 06/24/2022               Charge Capture   Episode (Low back pain)         Treatment Diagnosis:     Other low back pain  Acute bilateral low back pain with right-sided sciatica  Muscle weakness (generalized)  Medical/Referring Diagnosis:    Back pain, unspecified back location, unspecified back pain laterality, unspecified chronicity [M54.9]    Referring Physician:  Tamsen Snider, APRN - CNP  MD Orders:  PT Eval and Treat   Return MD Appt:  TBD  Date of Onset:  Onset Date: 01/23/22     Allergies:  Metronidazole  Restrictions/Precautions:    None      Medications Last Reviewed:  06/24/2022     SUBJECTIVE   History of Injury/Illness (Reason for Referral):  Patient reports developing low back which she noted after the delivery of her daughter on 01/08/22.  nitially had soreness around the epidural site; constant in nature.  Later she noted pain radiating across her low back, and right hip with pain radiating to her toes. Numbness and tingling reported in her lateral thigh and foot.  Symptoms now associated  with low back aggravated  with bending, twisting, prolong standing and sitting. Righthhip pain with  mountain climbers Xrays were NAD.  Workouts 3 x per week with carido and weighted machines  Patient Stated Goal(s):  "To be pain free and be able to do mountain climbers again"  Initial Pain Level:     3/10   Post Session Pain Level:      /10  Past Medical History/Comorbidities:   Sandra Lewis  has a past medical history of Anxiety and depression.  Sandra Lewis  has no past surgical history on  file.  Social History/Living Environment:   PT/OT Social History/Living Environment: Patient lives with their family  Type of Home: House: One Story    Level of Function/Work/Activity:    PT/OT Level of Function/Work/Activity: Employment Status: Employed part time  Current Exercise Program/Activities: as above      Learning:   Does the patient/guardian have any barriers to learning?: No barriers  What is the preferred language of the patient/guardian?: English  How does the patient/guardian prefer to learn new concepts?: Pictures/Videos; Demonstration  Fall Risk Scale:   Morse Total Score: 0  Dominant Side:  right handed      OBJECTIVE   Observation: Decreased weight bearing through RLE    Posture: Normal spinal curves    Palpation:  Mild right inflare                     Pain with L3 CPA                     Stiff thoracic CPA  Tender bilateral paraspinals and right psoas      ROM:   Date:  06/24/22       Right Left   Lumbar flex 60    Lumbar ext 10*    Side bending 100% 100%* on R   Rotation 75% 75%        Hip flex WNL WNL   Hip ER WNL WNL      Strength:   Date:  06/24/22       Right Left   Hip flex 4 5   Hip abd 4- 5   Hip ext 4- 5   Knee ext 5-    Knee flex 4+ 5        Decreased trunk stability R with LLE testing       Special Tests:  SLS decreased stabiltiyt with RLE                             Right SLUMP  psotivie    ASSESSMENT   Initial Assessment:  Patient on exam presents with decreased ROM, strength,soft tissue integrity and pain with mobility.  Patient will require skilled therapeutic intervention to address impairments assessed to allow for return to PLOF    Therapy Problem List: (Impacting functional limitations):    PT/OT Problem List: Increased Pain, Decreased Strength, Decreased ROM, Decreased Functional Mobility, Decreased Independence with Home Exercise Program, Decreased Posture, Decreased Body Mechanics, and Decreased Activity Tolerance/Endurance*     Therapy Prognosis:    Good     Initial Assessment Complexity:   Low Complexity       PLAN   Effective Dates: 06/24/2022 TO     Frequency/Duration:     Interventions Planned (Treatment may consist of any combination of the following):    PT INTERVENTION LIST: Home Exercise Program (HEP), Manual Therapy, Neuromuscular Re-education/Strengthening, Range of Motion (ROM), Therapeutic Activites, Therapeutic Exercise/Strengthening, Patient/Caregiver Education & Training, Safety Education and Training, and Dry Needling       Goals: (Goals have been discussed and agreed upon with patient.)  Short-Term Functional Goals: Time Frame: ***  ***  Discharge Goals: Time Frame: ***  ***       Outcome Measure:   Tool Used: Modified Oswestry Low Back Pain Questionnaire  Score:  Initial: ***/50  Most Recent: X/50 (Date: -- )   Interpretation of Score: Each section is scored on a 0-5 scale, 5 representing the greatest disability.  The scores of each section are added together for a total score of 50.      Medical Necessity:   > Patient is expected to demonstrate progress in strength, range of motion, and functional technique to to allow for return to PLOF.  Reason For Services/Other Comments:  > ***      Regarding Sandra Lewis's therapy, I certify that the treatment plan above will be carried out by a therapist or under their direction.  Thank you for this referral,  Harlene Salts, PT     Referring Physician Signature: Randa Lynn, AP* _______________________________ Date _____________        Charge Capture  Appt Desk

## 2022-06-24 NOTE — Other (Incomplete)
Sandra Lewis  DOB: 19-May-1991  Primary: [facility] (Medicaid Managed)  Secondary:  [facility]  [address]  [address]  [place] [address]  Phone: [phone]  Fax: [phone]           Plan of Care/Certification Expiration Date:     Time In/Out:      PT Visit Info:         Visit Count:  1                OUTPATIENT PHYSICAL THERAPY:             {OP NOTE DGUY:40347} 06/24/2022               Charge Capture   Episode (Low back pain)         Treatment Diagnosis:  ***   No data found  Medical/Referring Diagnosis:    {Medical Diagnosis:65500}   Referring Physician:  Randa Lynn, APRN - CNP  MD Orders:  PT Eval and Treat ***  Return MD Appt:  ***  Date of Onset:    ***  Allergies:  Metronidazole  Restrictions/Precautions:    {Precautions/Restrictions:64212}      Medications Last Reviewed:  06/24/2022     SUBJECTIVE   History of Injury/Illness (Reason for Referral):  Patient reports developing low back which she noted after the delivery of her daughter on 01/08/22.  nitially had soreness around the epidural site; constant in nature.  Later she noted pain radiating across her low back, and right hip with pain radiating to her toes. Numbness and tingling reported in her lateral thigh and foot.  Symptoms now associated  with low back aggravated  with bending, twisting, prolong standing and sitting. Righthhip pain with  mountain climbers Xrays were NAD.  Workouts 3 x per week with carido and weighted machines  Patient Stated Goal(s):  "***"  Initial Pain Level:     3/10   Post Session Pain Level:      /10  Past Medical History/Comorbidities:   Sandra Lewis  has a past medical history of Anxiety and depression.  Sandra Lewis  has no past surgical history on file.  Social History/Living Environment:   PT/OT Social History/Living Environment: Patient lives with their family  Type of Home: House: One Story    Level of Function/Work/Activity:    PT/OT Level of Function/Work/Activity: Employment  Status: Employed part time  Current Exercise Program/Activities: as above      Learning:   Does the patient/guardian have any barriers to learning?: No barriers  What is the preferred language of the patient/guardian?: English  How does the patient/guardian prefer to learn new concepts?: Pictures/Videos; Demonstration  Fall Risk Scale: ***  Morse Total Score: 0  Dominant Side:  right handed      OBJECTIVE   Observation:     Posture: NAD    Palpation:  Mild right inflare                     Pain at L3CPA      ROM:   Date:         Right Left    60     10*     100 100* on R                          Strength:   Date:         Right Left    ***  Hip abd 4-    Hip ext 4-                   Decreased trunk stability R       Special Tests:  SLS decreased stabiltiyt with RLE                             Right SLUMP  psotivie    ASSESSMENT   Initial Assessment:  ***  Therapy Problem List: (Impacting functional limitations):    {PT/OT Problem List:58469::"Decreased Strength","Decreased ROM","Decreased Functional Mobility","Decreased Independence with Home Exercise Program","Decreased Posture","Decreased Body Mechanics","Decreased Activity Tolerance/Endurance*"}     Therapy Prognosis:   {Therapy Prognosis:64767}     Initial Assessment Complexity:   {Assessment Complexity UYQIH:47425}       PLAN   Effective Dates: 06/24/2022 TO     Frequency/Duration:     Interventions Planned (Treatment may consist of any combination of the following):    {PT INTERVENTION LIST:58468::"Home Exercise Program (HEP)","Therapeutic Activites","Therapeutic Exercise/Strengthening"}       Goals: (Goals have been discussed and agreed upon with patient.)  Short-Term Functional Goals: Time Frame: ***  ***  Discharge Goals: Time Frame: ***  ***       Outcome Measure:   {OUTCOME ZDGLOVFI:43329}    Medical Necessity:   {PT Medical Necessity:39885}  Reason For Services/Other Comments:  {PT Continuation:39886}      Regarding Sandra Lewis's therapy, I certify  that the treatment plan above will be carried out by a therapist or under their direction.  Thank you for this referral,  Harlene Salts, PT     Referring Physician Signature: Randa Lynn, AP* {MD Signature Line:45300}        Charge Capture  Appt Desk

## 2024-03-03 ENCOUNTER — Encounter

## 2024-04-01 ENCOUNTER — Inpatient Hospital Stay

## 2024-04-01 ENCOUNTER — Encounter

## 2024-04-08 ENCOUNTER — Ambulatory Visit

## 2024-04-15 ENCOUNTER — Inpatient Hospital Stay: Admit: 2024-04-15 | Payer: PRIVATE HEALTH INSURANCE

## 2024-04-15 DIAGNOSIS — R1011 Right upper quadrant pain: Principal | ICD-10-CM

## 2024-04-15 MED ORDER — SINCALIDE 5 MCG IJ SOLR
5 | Freq: Once | INTRAMUSCULAR | Status: AC
Start: 2024-04-15 — End: 2024-04-15
  Administered 2024-04-15: 14:00:00 1.34 ug/kg via INTRAVENOUS

## 2024-04-15 MED ORDER — TECHNETIUM TC 99M MEBROFENIN IV KIT
Freq: Once | INTRAVENOUS | Status: AC | PRN
Start: 2024-04-15 — End: 2024-04-15
  Administered 2024-04-15: 13:00:00 6 via INTRAVENOUS
